# Patient Record
Sex: Female | Born: 1970 | Race: Black or African American | Hispanic: No | Marital: Single | Smoking: Never smoker
Health system: Southern US, Community
[De-identification: ages and names within clinical notes are randomized; demographics above are authoritative.]

## PROBLEM LIST (undated history)

## (undated) DIAGNOSIS — Z9889 Other specified postprocedural states: Secondary | ICD-10-CM

## (undated) DIAGNOSIS — D649 Anemia, unspecified: Secondary | ICD-10-CM

## (undated) DIAGNOSIS — N970 Female infertility associated with anovulation: Secondary | ICD-10-CM

## (undated) DIAGNOSIS — R112 Nausea with vomiting, unspecified: Secondary | ICD-10-CM

## (undated) DIAGNOSIS — A6000 Herpesviral infection of urogenital system, unspecified: Secondary | ICD-10-CM

## (undated) DIAGNOSIS — K635 Polyp of colon: Secondary | ICD-10-CM

## (undated) DIAGNOSIS — Z8719 Personal history of other diseases of the digestive system: Secondary | ICD-10-CM

## (undated) DIAGNOSIS — Z800 Family history of malignant neoplasm of digestive organs: Secondary | ICD-10-CM

## (undated) DIAGNOSIS — R7303 Prediabetes: Secondary | ICD-10-CM

## (undated) DIAGNOSIS — Z8742 Personal history of other diseases of the female genital tract: Secondary | ICD-10-CM

## (undated) DIAGNOSIS — R42 Dizziness and giddiness: Secondary | ICD-10-CM

## (undated) DIAGNOSIS — I10 Essential (primary) hypertension: Secondary | ICD-10-CM

## (undated) DIAGNOSIS — K589 Irritable bowel syndrome without diarrhea: Secondary | ICD-10-CM

## (undated) DIAGNOSIS — G43909 Migraine, unspecified, not intractable, without status migrainosus: Secondary | ICD-10-CM

## (undated) DIAGNOSIS — R011 Cardiac murmur, unspecified: Secondary | ICD-10-CM

## (undated) HISTORY — DX: Migraine, unspecified, not intractable, without status migrainosus: G43.909

## (undated) HISTORY — DX: Essential (primary) hypertension: I10

## (undated) HISTORY — DX: Polyp of colon: K63.5

## (undated) HISTORY — DX: Family history of malignant neoplasm of digestive organs: Z80.0

## (undated) HISTORY — PX: DILATION AND CURETTAGE OF UTERUS: SHX78

## (undated) HISTORY — DX: Irritable bowel syndrome, unspecified: K58.9

## (undated) HISTORY — DX: Herpesviral infection of urogenital system, unspecified: A60.00

## (undated) HISTORY — DX: Female infertility associated with anovulation: N97.0

## (undated) HISTORY — DX: Personal history of other diseases of the female genital tract: Z87.42

---

## 1997-07-07 HISTORY — PX: LAPAROSCOPY: SHX197

## 2005-04-11 ENCOUNTER — Ambulatory Visit: Payer: Self-pay | Admitting: Neurology

## 2009-03-27 ENCOUNTER — Emergency Department: Payer: Self-pay | Admitting: Emergency Medicine

## 2010-02-22 ENCOUNTER — Emergency Department: Payer: Self-pay | Admitting: Emergency Medicine

## 2010-10-26 ENCOUNTER — Emergency Department: Payer: Self-pay | Admitting: Emergency Medicine

## 2011-01-07 ENCOUNTER — Ambulatory Visit: Payer: Self-pay | Admitting: Family Medicine

## 2011-02-05 DIAGNOSIS — Z8742 Personal history of other diseases of the female genital tract: Secondary | ICD-10-CM

## 2011-02-05 HISTORY — DX: Personal history of other diseases of the female genital tract: Z87.42

## 2012-05-07 HISTORY — PX: URETHRA SURGERY: SHX824

## 2012-05-26 ENCOUNTER — Ambulatory Visit: Payer: Self-pay | Admitting: Urology

## 2012-05-31 ENCOUNTER — Ambulatory Visit: Payer: Self-pay | Admitting: Urology

## 2012-06-01 LAB — PATHOLOGY REPORT

## 2013-11-30 DIAGNOSIS — N970 Female infertility associated with anovulation: Secondary | ICD-10-CM

## 2013-11-30 HISTORY — DX: Female infertility associated with anovulation: N97.0

## 2014-01-26 ENCOUNTER — Ambulatory Visit: Payer: Self-pay | Admitting: Obstetrics and Gynecology

## 2014-08-11 ENCOUNTER — Encounter: Payer: Self-pay | Admitting: Podiatry

## 2014-08-11 ENCOUNTER — Ambulatory Visit (INDEPENDENT_AMBULATORY_CARE_PROVIDER_SITE_OTHER): Payer: Commercial Managed Care - PPO | Admitting: Podiatry

## 2014-08-11 ENCOUNTER — Ambulatory Visit (INDEPENDENT_AMBULATORY_CARE_PROVIDER_SITE_OTHER): Payer: Commercial Managed Care - PPO

## 2014-08-11 ENCOUNTER — Ambulatory Visit: Payer: Self-pay

## 2014-08-11 ENCOUNTER — Ambulatory Visit: Payer: Commercial Managed Care - PPO

## 2014-08-11 VITALS — BP 147/72 | HR 82 | Resp 16

## 2014-08-11 DIAGNOSIS — M79671 Pain in right foot: Secondary | ICD-10-CM

## 2014-08-11 DIAGNOSIS — M779 Enthesopathy, unspecified: Secondary | ICD-10-CM

## 2014-08-11 MED ORDER — TRIAMCINOLONE ACETONIDE 10 MG/ML IJ SUSP
10.0000 mg | Freq: Once | INTRAMUSCULAR | Status: AC
  Administered 2014-08-11: 10 mg

## 2014-08-11 NOTE — Progress Notes (Signed)
   Subjective:    Patient ID: Mckenzie Lozano, female    DOB: Feb 12, 1971, 44 y.o.   MRN: [id number]  HPI Comments: "I have pain in the right one"  Patient c/o sharp sensation 4th toe and MPJ right for about 2 weeks. She remembers twisting her ankle at some point. Swelling plantar forefoot. AM pain and any time after resting. She tried padding-helped.  Foot Pain Associated symptoms include abdominal pain, chest pain, coughing, diaphoresis, fatigue and headaches.      Review of Systems  Constitutional: Positive for diaphoresis and fatigue.  HENT: Positive for sinus pressure and tinnitus.   Eyes: Positive for itching.  Respiratory: Positive for cough.   Cardiovascular: Positive for chest pain and leg swelling.  Gastrointestinal: Positive for abdominal pain, diarrhea and constipation.  Endocrine: Positive for polyuria.  Genitourinary: Positive for frequency.  Musculoskeletal: Positive for back pain and gait problem.  Allergic/Immunologic: Positive for environmental allergies.  Neurological: Positive for dizziness, light-headedness and headaches.  Hematological: Bruises/bleeds easily.       Objective:   Physical Exam        Assessment & Plan:

## 2014-08-13 NOTE — Progress Notes (Signed)
Subjective:     Patient ID: Mckenzie Lozano, female   DOB: 1970/08/16, 44 y.o.   MRN: [id number]  HPI patient presents stating I'm having a lot of pain on my right foot around this joint that's making it hard for me to walk comfortably or wear shoe gear with any degree of comfort. States it's been present for about a month   Review of Systems  All other systems reviewed and are negative.      Objective:   Physical Exam  Constitutional: She is oriented to person, place, and time.  Cardiovascular: Intact distal pulses.   Musculoskeletal: Normal range of motion.  Neurological: She is oriented to person, place, and time.  Skin: Skin is warm.  Nursing note and vitals reviewed.  vascular status intact muscle strength adequate with range of motion within normal limits. Patient's noted to have forefoot edema with inflammation and pain around the fourth metatarsal phalangeal joint and no indications of a more proximal inflammation around the bone structure     Assessment:     Probable inflammatory capsulitis fourth MPJ right and cannot rule out bony stress fracture or arthritis    Plan:     H&P and x-ray reviewed. Explained condition and today did proximal nerve block around the fourth MPJ aspirated the joint giving out a small amount of clear fluid and injected with a quarter cc of dexamethasone Kenalog and applied thick plantar pad to reduce stress on the joint. Reappoint 2 weeks

## 2014-10-24 NOTE — H&P (Signed)
PATIENT NAME:  Mckenzie Lozano, Mckenzie Lozano MR#:  [id number] DATE OF BIRTH:  [birth year]  DATE OF ADMISSION:  05/31/2012  CHIEF COMPLAINT: Recurrent urinary tract infections and difficulty voiding.   HISTORY OF PRESENT ILLNESS: Mckenzie Lozano is a 44 year old African American female who has had five or six UTIs in the past year. She reports deflexion of her urinary stream. She was found to have a cystic lesion at the 6 o'clock position of the urethral meatus consistent with caruncle. She comes in now for excisional biopsy of this lesion.   ALLERGIES: No drug allergies.   CURRENT MEDICATIONS: Multivitamins.   PAST SURGICAL HISTORY: Diagnostic laparoscopy in 1999.   SOCIAL HISTORY: She denied tobacco use. She consumes 1 to 4 alcoholic beverages per week.   FAMILY HISTORY: Remarkable for parents with hypertension and hypercholesterolemia.   PAST AND CURRENT MEDICAL CONDITIONS: 1. Migraine headaches.  2. Gastroesophageal reflux disease.  3. Herpes simplex type II.   REVIEW OF SYSTEMS: The patient has lost 20 pounds in last six months. She denied chest pain or shortness of breath. She does have difficulty sleeping. She has some fatigue. She has chronic constipation.   PHYSICAL EXAMINATION:   GENERAL: A well-nourished African American female in no distress.   HEENT: Sclerae were clear. Pupils were equally round and reactive to light and accommodation. Extraocular movements were intact.   NECK: No palpable masses or tenderness. Thyroid gland was smooth and nontender. No audible carotid bruits.   PULMONARY: Lungs were clear to auscultation.   CARDIOVASCULAR: Regular rhythm and rate without audible murmurs.   ABDOMEN: Soft, nontender abdomen.   GENITOURINARY: The patient had a slightly raised area of the urethral meatus, at approximately the 5e o'clock position. No vaginal prolapse. Good anterior support.   RECTAL: No palpable rectal masses.   NEUROPSYCHIATRIC: Alert and oriented x3.   IMPRESSION:   1. 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 2. Recurrent urinary tract infections.  PLAN: Excisional biopsy of the urethral lesion and cystoscopy.  ____________________________ Otelia Limes. Yves Dill, MD mrw:slb D: 05/26/2012 12:05:00 ET T: 05/26/2012 12:15:56 ET JOB#: [id number]  cc: Otelia Limes. Yves Dill, MD, <Dictator> Royston Cowper MD ELECTRONICALLY SIGNED 05/26/2012 14:49

## 2014-10-24 NOTE — Op Note (Signed)
PATIENT NAME:  Mckenzie Lozano, MASSAR MR#:  [id number] DATE OF BIRTH:  1971/01/06  DATE OF PROCEDURE:  05/31/2012  PREOPERATIVE DIAGNOSES:  1. Urethral caruncle.  2. Recurrent urinary tract infections.   POSTOPERATIVE DIAGNOSES:  1. Urethral caruncle.  2. Recurrent urinary tract infections.   PROCEDURES:  1. Cystoscopy.  2. Excisional biopsy of urethral caruncle.   SURGEON: Otelia Limes. Yves Dill, MD  ANESTHETIST: Marcello Moores.   ANESTHETIC METHOD: General.   INDICATIONS: See the dictated history and physical. After informed consent patient requests above procedure.   OPERATIVE SUMMARY: After adequate general anesthesia had been obtained, patient was placed into dorsal lithotomy position and the perineum was prepped and draped in the usual fashion. The 21 French cystoscope was then coupled with the camera and then placed into the bladder. Bladder was thoroughly inspected. Both ureteral orifices were identified and had clear efflux. No bladder mucosal lesions were identified. At this point the scope was removed. Labia were retracted laterally with 2-0 silk ties. Patient was noted to have a 5 mm cystic lesion at the 6:00 position of the urethral meatus consistent with caruncle. The lesion was sharply excised and bleeders were controlled with electrocautery. Urethral edges were reapproximated with interrupted 4-0 chromic suture.     At this point procedure was terminated. Patient tolerated procedure well and was transferred to the recovery room in stable condition.   ____________________________ Otelia Limes. Yves Dill, MD mrw:cms D: 05/31/2012 08:35:58 ET T: 05/31/2012 10:14:41 ET JOB#: [id number]  cc: Otelia Limes. Yves Dill, MD, <Dictator>  Entered as incorrect report type; entered as consultation and should be operative report.  Royston Cowper MD ELECTRONICALLY SIGNED 05/31/2012 13:08

## 2014-10-28 NOTE — Op Note (Signed)
PATIENT NAME:  Mckenzie Lozano, Mckenzie Lozano MR#:  [id number] DATE OF BIRTH:  06/29/1971  DATE OF PROCEDURE:  01/26/2014  PREPROCEDURE DIAGNOSIS: Infertility.  POSTPROCEDURE DIAGNOSIS: Infertility.  PROCEDURE: Hysterosalpingogram under fluoroscopy.  PROCEDURE PERFORMER: Erik Obey, MD  ESTIMATED BLOOD LOSS: 2 mL.   FINDINGS: Normal fluoroscopy. The uterus to fill and spill bilaterally.   DESCRIPTION OF PROCEDURE: The patient was taken to radiology. Side-opening speculum was placed in the patient's vagina. This was removed after a bulb catheter was placed into the uterus and the radiopaque dye was placed into the uterus. The patient was then laid supine and the patient tolerated the procedure well.  ____________________________ Delsa Sale, MD cck:sb D: 01/30/2014 23:08:23 ET T: 01/31/2014 07:07:26 ET JOB#: [id number]  cc: Delsa Sale, MD, <Dictator> Delsa Sale MD ELECTRONICALLY SIGNED 02/02/2014 16:00

## 2015-08-30 ENCOUNTER — Other Ambulatory Visit: Payer: Self-pay | Admitting: Nurse Practitioner

## 2015-08-30 DIAGNOSIS — R1032 Left lower quadrant pain: Principal | ICD-10-CM

## 2015-08-30 DIAGNOSIS — G8929 Other chronic pain: Secondary | ICD-10-CM

## 2015-09-04 ENCOUNTER — Ambulatory Visit
Admission: RE | Admit: 2015-09-04 | Discharge: 2015-09-04 | Disposition: A | Discharge status: Home / Self Care | Payer: 59 | Source: Ambulatory Visit | Attending: Nurse Practitioner | Admitting: Nurse Practitioner

## 2015-09-04 DIAGNOSIS — D1803 Hemangioma of intra-abdominal structures: Secondary | ICD-10-CM | POA: Insufficient documentation

## 2015-09-04 DIAGNOSIS — R1032 Left lower quadrant pain: Secondary | ICD-10-CM | POA: Diagnosis present

## 2015-09-04 DIAGNOSIS — G8929 Other chronic pain: Secondary | ICD-10-CM | POA: Diagnosis present

## 2015-09-04 LAB — POCT I-STAT CREATININE: Creatinine, Ser: 0.8 mg/dL (ref 0.44–1.00)

## 2015-09-04 MED ORDER — IOHEXOL 300 MG/ML  SOLN
100.0000 mL | Freq: Once | INTRAMUSCULAR | Status: AC | PRN
  Administered 2015-09-04: 100 mL via INTRAVENOUS

## 2015-10-06 HISTORY — PX: COLONOSCOPY: SHX174

## 2017-03-30 IMAGING — CT CT ABD-PELV W/ CM
1 of 3 series · 14 of 32 positions shown, 19 images · IV contrast (APPLIED)
Comparison: CT abdomen report 05/22/2006

CLINICAL DATA: LEFT side abdominal pain for 20 years.

EXAM:
CT ABDOMEN AND PELVIS WITH CONTRAST
TECHNIQUE: Multidetector CT imaging of the abdomen and pelvis was performed
using the standard protocol following bolus administration of
intravenous contrast.
CONTRAST:  100mL OMNIPAQUE IOHEXOL 300 MG/ML  SOLN

[Series 2: axial st · axial · 0.68mm/px · z∈[-910,-495]mm · 14 of 93 slices shown, 19 images]
[im 5/93  soft-tissue]
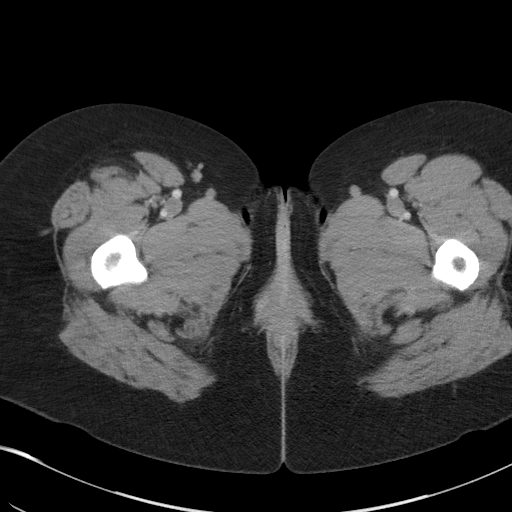
[im 5/93  bone]
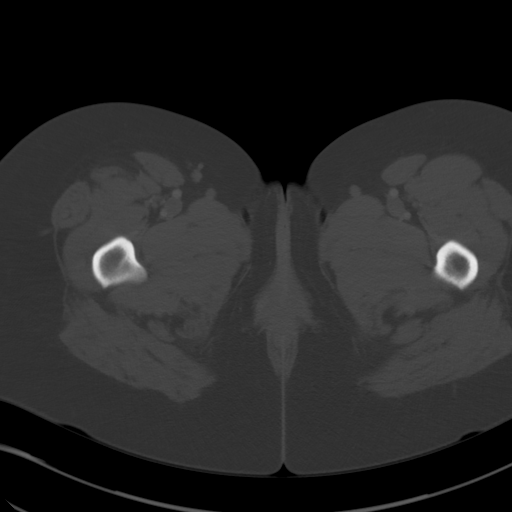
[im 15/93  soft-tissue]
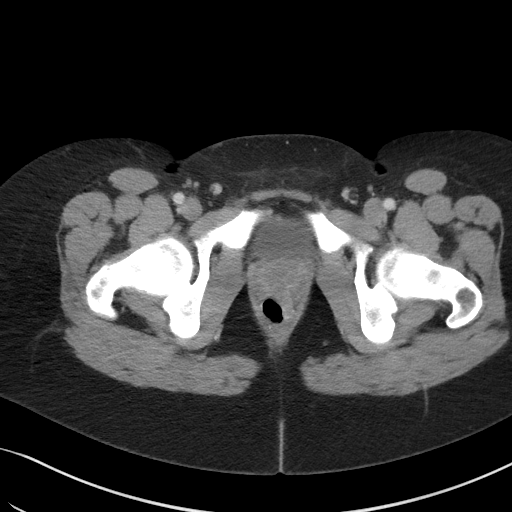
[im 20/93  soft-tissue]
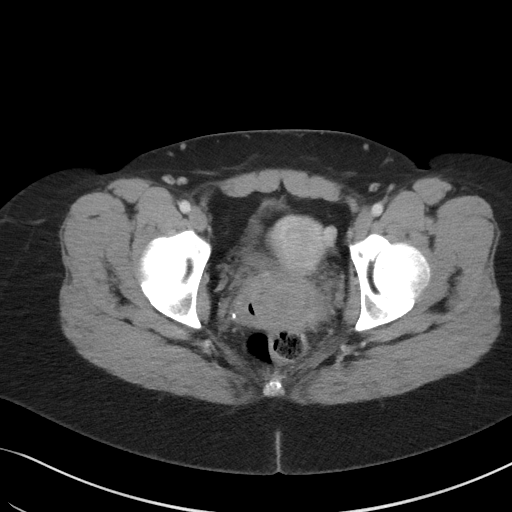
[im 25/93  soft-tissue]
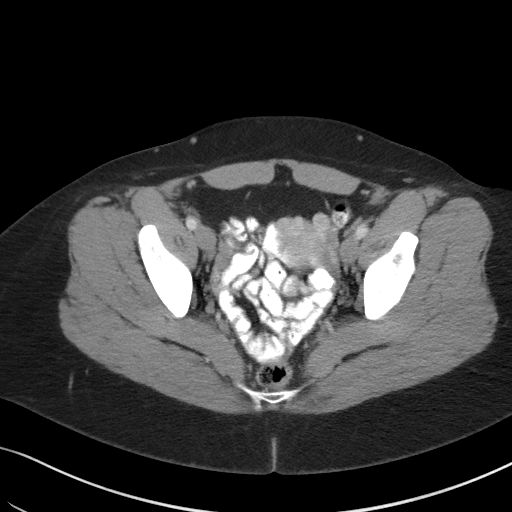
[im 34/93  soft-tissue]
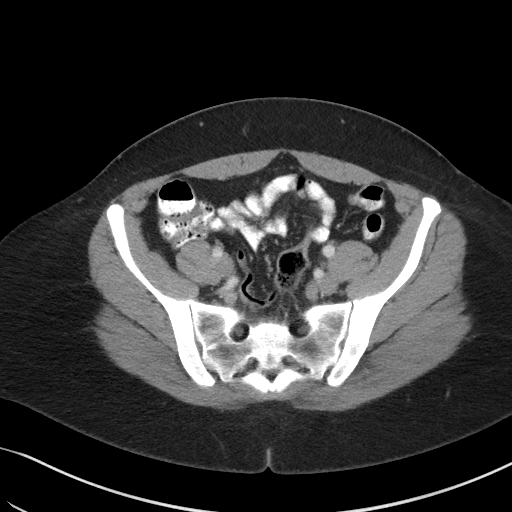
[im 39/93  soft-tissue]
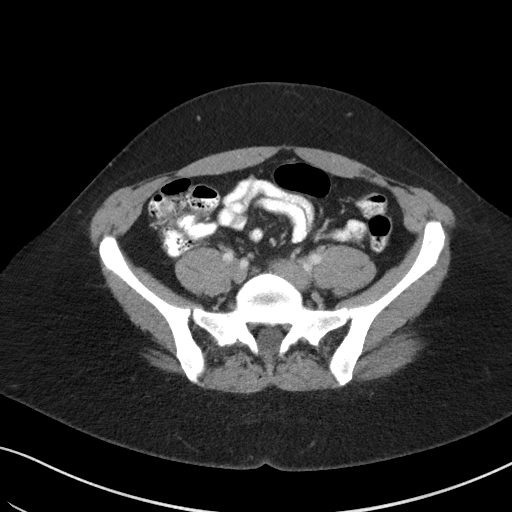
[im 49/93  soft-tissue]
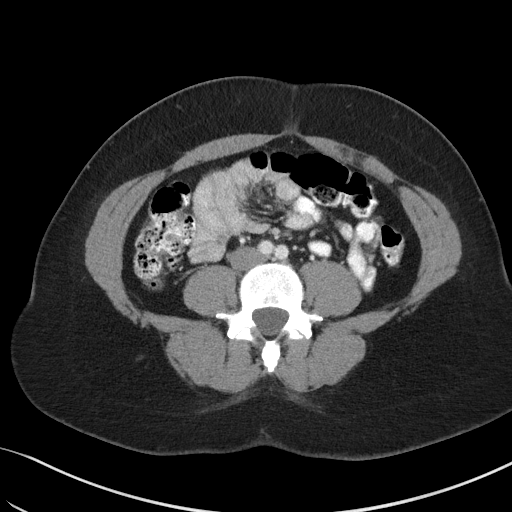
[im 54/93  soft-tissue]
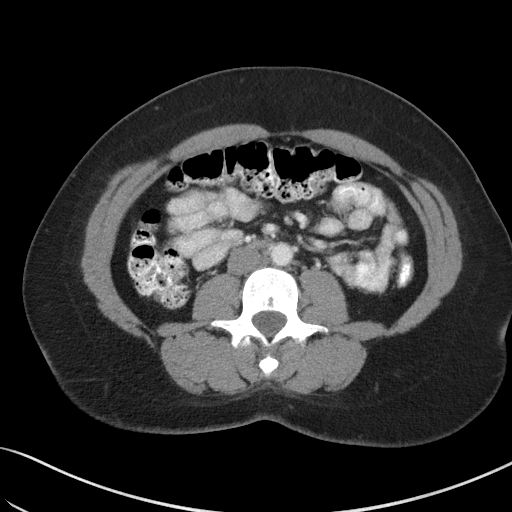
[im 59/93  soft-tissue]
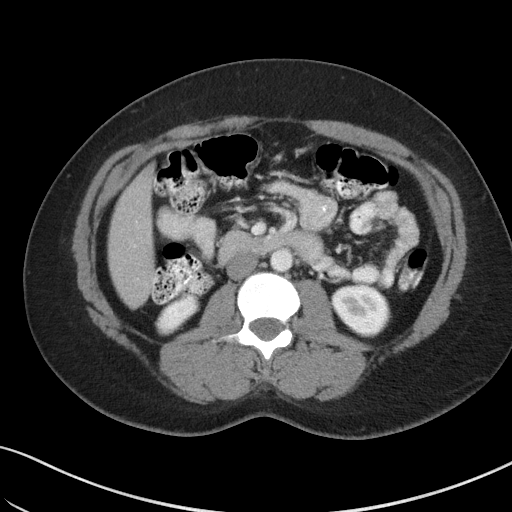
[im 59/93  bone]
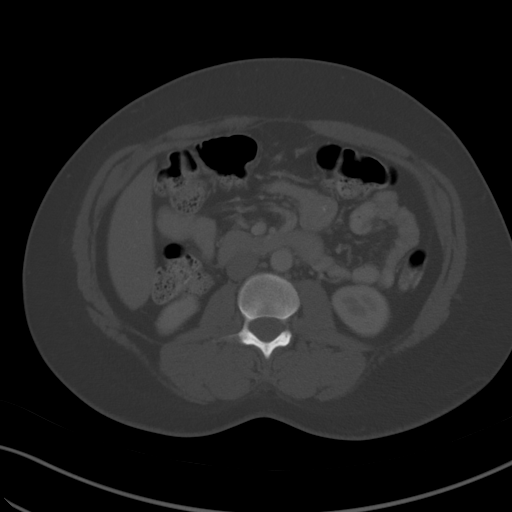
[im 68/93  soft-tissue]
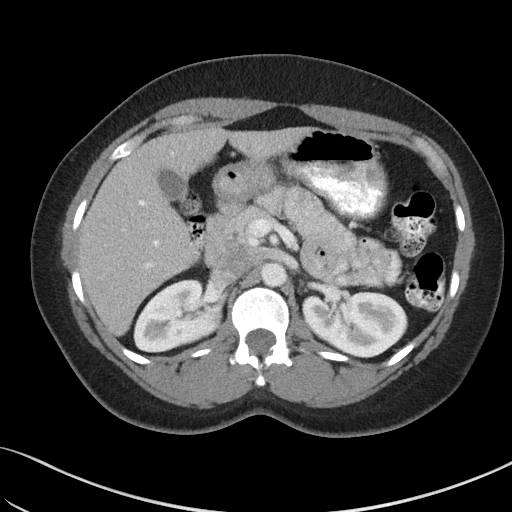
[im 73/93  soft-tissue]
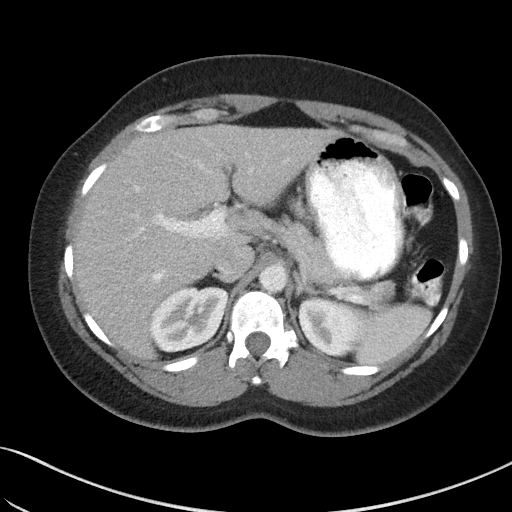
[im 73/93  lung]
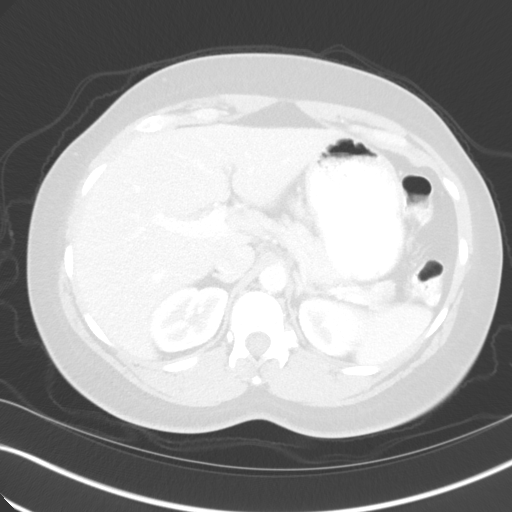
[im 78/93  soft-tissue]
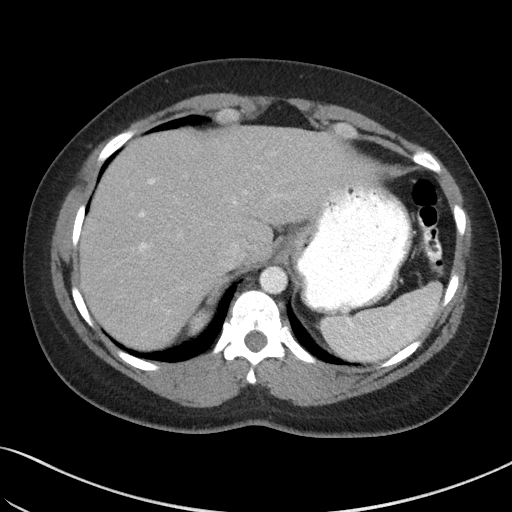
[im 78/93  lung]
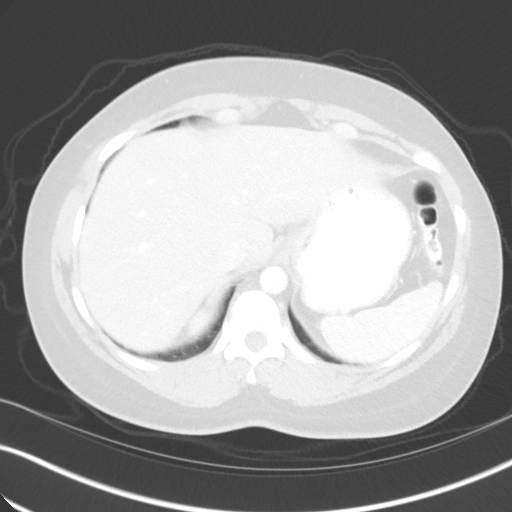
[im 83/93  lung]
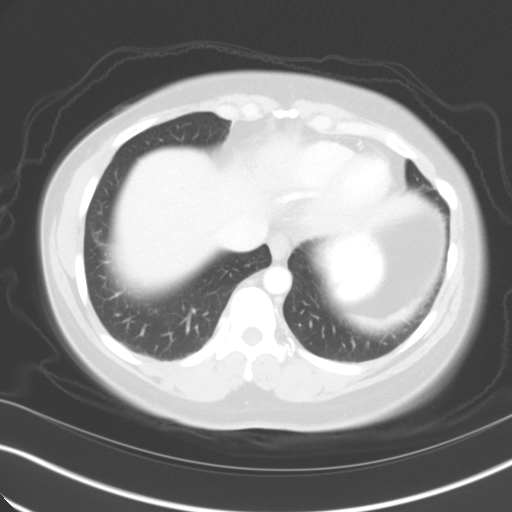
[im 88/93  soft-tissue]
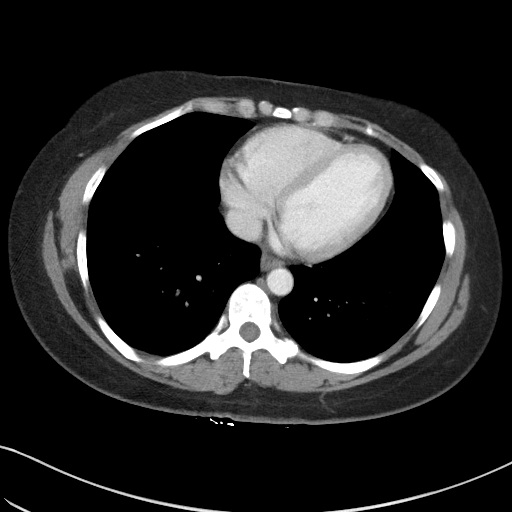
[im 88/93  lung]
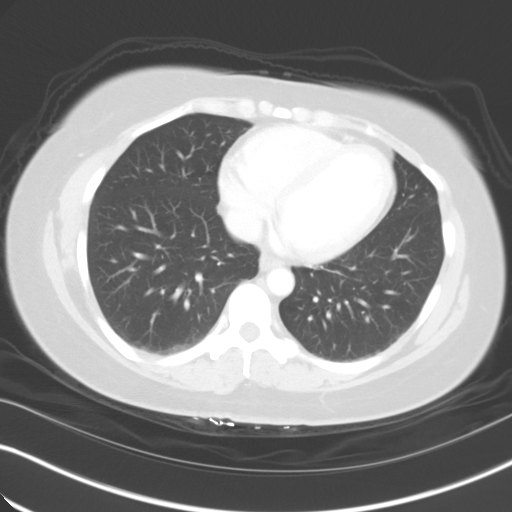

[14 of 32 positions shown; findings below may reference images not displayed]

FINDINGS: Lower chest: Lung bases are clear.

Hepatobiliary: Small enhancing lesion in the subcapsular RIGHT
hepatic lobe measuring 7 mm image 32, series 2. Normal gallbladder.
Normal common bile duct.

Pancreas: Pancreas is normal. No ductal dilatation. No pancreatic
inflammation.

Spleen: Normal spleen

Adrenals/urinary tract: Adrenal glands and kidneys are normal. The
ureters and bladder normal.

Stomach/Bowel: Stomach, small bowel, appendix, and cecum are normal.
The colon and rectosigmoid colon are normal.

Vascular/Lymphatic: Abdominal aorta is normal caliber. There is no
retroperitoneal or periportal lymphadenopathy. No pelvic
lymphadenopathy.

Reproductive: Uterus and ovaries are normal.  No free fluid.

Other: No peritoneal disease.  No inguinal hernia or ventral hernia.

Musculoskeletal: No aggressive osseous lesion.
IMPRESSION: 1. No acute findings in the abdomen pelvis.
2. No ventral hernia or inguinal hernia .
3. Small enhancing lesion in the inferior RIGHT hepatic lobe is too
small to characterize but favor a benign hemangioma. If patient has
risk factors for malignancy, lesion could be further evaluated with
contrast abdominal MRI.

## 2017-04-15 ENCOUNTER — Telehealth: Payer: Self-pay

## 2017-04-15 ENCOUNTER — Other Ambulatory Visit: Payer: Self-pay | Admitting: Obstetrics and Gynecology

## 2017-04-15 DIAGNOSIS — R6889 Other general symptoms and signs: Secondary | ICD-10-CM

## 2017-04-15 DIAGNOSIS — R7303 Prediabetes: Secondary | ICD-10-CM

## 2017-04-15 NOTE — Telephone Encounter (Signed)
Pt requesting a referral to an endocrinologist re hormone levels as she is having difficulty c sweating, cold intolerance, and blurred vision. [phone]

## 2017-04-15 NOTE — Telephone Encounter (Signed)
OK 

## 2017-04-15 NOTE — Progress Notes (Signed)
Pt wants ref to endocrine for blurred vision, cold intolerance, sweating. Hasn't had recent thyroid labs. Orders in computer to check them first. See eye MD for vision exam, first, too.

## 2017-04-15 NOTE — Telephone Encounter (Signed)
Needs labs first. Hx of pre-DM 2017. Hasn't had recent thyroid labs. Orders in computer. See eye MD for vision exam, first, too. If abn thyroid labs, can refer to endocrine. RN to discuss with pt.

## 2017-04-15 NOTE — Telephone Encounter (Signed)
Pt aware.  States she had eye exam in 02/2017.  Sched c lab 10/16th.

## 2017-04-21 ENCOUNTER — Other Ambulatory Visit: Payer: 59

## 2017-04-21 DIAGNOSIS — R7303 Prediabetes: Secondary | ICD-10-CM

## 2017-04-21 DIAGNOSIS — R6889 Other general symptoms and signs: Secondary | ICD-10-CM

## 2017-04-22 LAB — COMPREHENSIVE METABOLIC PANEL
A/G RATIO: 1.3 (ref 1.2–2.2)
ALT: 12 IU/L (ref 0–32)
AST: 16 IU/L (ref 0–40)
Albumin: 3.9 g/dL (ref 3.5–5.5)
Alkaline Phosphatase: 85 IU/L (ref 39–117)
BUN/Creatinine Ratio: 17 (ref 9–23)
BUN: 13 mg/dL (ref 6–24)
Bilirubin Total: 0.5 mg/dL (ref 0.0–1.2)
CALCIUM: 8.9 mg/dL (ref 8.7–10.2)
CO2: 24 mmol/L (ref 20–29)
CREATININE: 0.75 mg/dL (ref 0.57–1.00)
Chloride: 104 mmol/L (ref 96–106)
GFR, EST AFRICAN AMERICAN: 111 mL/min/{1.73_m2} (ref >59)
GFR, EST NON AFRICAN AMERICAN: 97 mL/min/{1.73_m2} (ref >59)
GLUCOSE: 90 mg/dL (ref 65–99)
Globulin, Total: 2.9 g/dL (ref 1.5–4.5)
POTASSIUM: 4.1 mmol/L (ref 3.5–5.2)
Sodium: 142 mmol/L (ref 134–144)
TOTAL PROTEIN: 6.8 g/dL (ref 6.0–8.5)

## 2017-04-22 LAB — HEMOGLOBIN A1C
ESTIMATED AVERAGE GLUCOSE: 120 mg/dL
Hgb A1c MFr Bld: 5.8 % — ABNORMAL HIGH (ref 4.8–5.6)

## 2017-04-22 LAB — TSH+FREE T4
FREE T4: 1.03 ng/dL (ref 0.82–1.77)
TSH: 1.75 u[IU]/mL (ref 0.450–4.500)

## 2017-05-22 ENCOUNTER — Encounter: Payer: Self-pay | Admitting: Obstetrics and Gynecology

## 2017-05-26 ENCOUNTER — Encounter: Payer: Self-pay | Admitting: Obstetrics and Gynecology

## 2017-08-06 ENCOUNTER — Other Ambulatory Visit: Payer: Self-pay | Admitting: Podiatry

## 2017-08-12 ENCOUNTER — Ambulatory Visit: Payer: 59 | Admitting: Obstetrics and Gynecology

## 2017-08-13 ENCOUNTER — Ambulatory Visit (INDEPENDENT_AMBULATORY_CARE_PROVIDER_SITE_OTHER): Payer: Managed Care, Other (non HMO) | Admitting: Obstetrics and Gynecology

## 2017-08-13 ENCOUNTER — Encounter: Payer: Self-pay | Admitting: Obstetrics and Gynecology

## 2017-08-13 VITALS — BP 124/82 | Ht 62.0 in | Wt 182.0 lb

## 2017-08-13 DIAGNOSIS — Z1231 Encounter for screening mammogram for malignant neoplasm of breast: Secondary | ICD-10-CM

## 2017-08-13 DIAGNOSIS — Z01419 Encounter for gynecological examination (general) (routine) without abnormal findings: Secondary | ICD-10-CM

## 2017-08-13 DIAGNOSIS — Z124 Encounter for screening for malignant neoplasm of cervix: Secondary | ICD-10-CM | POA: Diagnosis not present

## 2017-08-13 DIAGNOSIS — Z1239 Encounter for other screening for malignant neoplasm of breast: Secondary | ICD-10-CM

## 2017-08-13 NOTE — Progress Notes (Signed)
PCP:  Patient, No Pcp Per   Chief Complaint  Patient presents with  . Gynecologic Exam     HPI:      [name] is a 47 y.o. G2P0020 who LMP was Patient's last menstrual period was 08/07/2017 (exact date)., presents today for her annual examination.  Her menses are regular every 32-33 days, lasting 5 days.  Dysmenorrhea mild, occurring first 1-2 days of flow. She does not have intermenstrual bleeding. Takes excedrin migraine for menstrual migraines with relief.   She cont to have LUQ pain and sees GI. Sx feel like muscle spasm/twisting. Had neg GYN eval.  Sex activity: single partner, contraception - none.  Last Pap: June 05, 2017  Results were: no abnormalities /neg HPV DNA. Likes yearly paps for insurance reimbursement. Hx of STDs: HSV, no recent sx  Last mammogram: May 22, 2017  Results were: normal--routine follow-up in 12 months There is no FH of breast cancer. There is no FH of ovarian cancer. The patient does do self-breast exams.  Tobacco use: The patient denies current or previous tobacco use. Alcohol use: social drinker No drug use.  Exercise: moderately active  She does get adequate calcium and Vitamin D in her diet.  Labs with PCP. Colonoscopy about 3 yrs ago. Due again in 2 yrs due to personal and FH polyps.    Past Medical History:  Diagnosis Date  . Anovulation 11/30/2013  . Colon polyps   . Diabetes mellitus without complication ([place])   . Herpes genitalis   . History of ovarian cyst 02/2011   right  . IBS (irritable bowel syndrome)   . Migraine     Past Surgical History:  Procedure Laterality Date  . COLONOSCOPY  10/2015   polyp, hemorrhoids; repeat in 5 yrs  . LAPAROSCOPY  1999   hiatal hernia  . URETHRA SURGERY  05/2012    Family History  Problem Relation Age of Onset  . Hypertension Mother   . Diabetes Brother   . Hyperlipidemia Brother   . Heart attack Brother     Social History   Socioeconomic History  .  Marital status: Single    Spouse name: Not on file  . Number of children: Not on file  . Years of education: Not on file  . Highest education level: Not on file  Social Needs  . Financial resource strain: Not on file  . Food insecurity - worry: Not on file  . Food insecurity - inability: Not on file  . Transportation needs - medical: Not on file  . Transportation needs - non-medical: Not on file  Occupational History  . Not on file  Tobacco Use  . Smoking status: Never Smoker  . Smokeless tobacco: Never Used  Substance and Sexual Activity  . Alcohol use: Yes    Alcohol/week: 0.0 oz  . Drug use: No  . Sexual activity: Yes    Birth control/protection: None  Other Topics Concern  . Not on file  Social History Narrative  . Not on file    Current Meds  Medication Sig  . amoxicillin-clavulanate (AUGMENTIN) 875-125 MG tablet Take 1 tablet by mouth every 12 (twelve) hours.  . Cholecalciferol (VITAMIN D-1000 MAX ST) 1000 units tablet Take by mouth.  . fluticasone (FLONASE) 50 MCG/ACT nasal spray PLACE 1 SPRAY INTO BOTH NOSTRILS 2 (TWO) TIMES DAILY     ROS:  Review of Systems  Constitutional: Positive for fatigue. Negative for fever and unexpected weight change.  Respiratory: Negative  for cough, shortness of breath and wheezing.   Cardiovascular: Negative for chest pain, palpitations and leg swelling.  Gastrointestinal: Positive for constipation and diarrhea. Negative for blood in stool, nausea and vomiting.  Endocrine: Negative for cold intolerance, heat intolerance and polyuria.  Genitourinary: Positive for dyspareunia. Negative for dysuria, flank pain, frequency, genital sores, hematuria, menstrual problem, pelvic pain, urgency, vaginal bleeding, vaginal discharge and vaginal pain.  Musculoskeletal: Negative for back pain, joint swelling and myalgias.  Skin: Negative for rash.  Neurological: Positive for headaches. Negative for dizziness, syncope, light-headedness and  numbness.  Hematological: Negative for adenopathy.  Psychiatric/Behavioral: Negative for agitation, confusion, sleep disturbance and suicidal ideas. The patient is not nervous/anxious.      Objective: BP 124/82   Ht 5\' 2"  (1.575 m)   Wt 182 lb (82.6 kg)   LMP 08/07/2017 (Exact Date)   BMI 33.29 kg/m    Physical Exam  Constitutional: She is oriented to person, place, and time. She appears well-developed and well-nourished.  Genitourinary: Vagina normal and uterus normal. There is no rash or tenderness on the right labia. There is no rash or tenderness on the left labia. No erythema or tenderness in the vagina. No vaginal discharge found. Right adnexum does not display mass and does not display tenderness. Left adnexum does not display mass and does not display tenderness. Cervix does not exhibit motion tenderness or polyp. Uterus is not enlarged or tender.  Neck: Normal range of motion. No thyromegaly present.  Cardiovascular: Normal rate and regular rhythm.  Murmur heard.  Systolic murmur is present with a grade of 1/6. Pulmonary/Chest: Effort normal and breath sounds normal. Right breast exhibits no mass, no nipple discharge, no skin change and no tenderness. Left breast exhibits no mass, no nipple discharge, no skin change and no tenderness.  Abdominal: Soft. There is no tenderness. There is no guarding.  Musculoskeletal: Normal range of motion.  Neurological: She is alert and oriented to person, place, and time. No cranial nerve deficit.  Psychiatric: She has a normal mood and affect. Her behavior is normal.  Vitals reviewed.   Assessment/Plan: Encounter for annual routine gynecological examination  Cervical cancer screening - Plain pap for ins reimbursement. - Plan: Pap IG (Image Guided)  Screening for breast cancer - Pt to sched 11/19. - Plan: MM DIGITAL SCREENING BILATERAL   GYN counsel breast self exam, mammography screening, menopause, adequate intake of calcium and  vitamin D, diet and exercise     F/U  Return in about 1 year (around 08/13/2018).  Kirstyn Lean B. Jakaleb Payer, PA-C 08/13/2017 3:50 PM

## 2017-08-13 NOTE — Patient Instructions (Signed)
I value your feedback and entrusting us with your care. If you get a [facility] patient survey, I would appreciate you taking the time to let us know about your experience today. Thank you! 

## 2017-08-15 LAB — PAP IG (IMAGE GUIDED): PAP SMEAR COMMENT: 0

## 2017-09-24 ENCOUNTER — Other Ambulatory Visit: Payer: Self-pay | Admitting: Internal Medicine

## 2017-09-24 DIAGNOSIS — R1031 Right lower quadrant pain: Secondary | ICD-10-CM

## 2017-09-24 DIAGNOSIS — R1032 Left lower quadrant pain: Principal | ICD-10-CM

## 2017-09-25 ENCOUNTER — Ambulatory Visit: Admission: RE | Admit: 2017-09-25 | Payer: Managed Care, Other (non HMO) | Source: Ambulatory Visit

## 2017-09-28 ENCOUNTER — Encounter
Admission: RE | Admit: 2017-09-28 | Discharge: 2017-09-28 | Disposition: A | Discharge status: Home / Self Care | Payer: Managed Care, Other (non HMO) | Source: Ambulatory Visit | Attending: Podiatry | Admitting: Podiatry

## 2017-09-28 ENCOUNTER — Other Ambulatory Visit: Payer: Self-pay

## 2017-09-28 DIAGNOSIS — Z01818 Encounter for other preprocedural examination: Secondary | ICD-10-CM | POA: Diagnosis present

## 2017-09-28 HISTORY — DX: Nausea with vomiting, unspecified: R11.2

## 2017-09-28 HISTORY — DX: Personal history of other diseases of the digestive system: Z87.19

## 2017-09-28 HISTORY — DX: Dizziness and giddiness: R42

## 2017-09-28 HISTORY — DX: Anemia, unspecified: D64.9

## 2017-09-28 HISTORY — DX: Cardiac murmur, unspecified: R01.1

## 2017-09-28 HISTORY — DX: Prediabetes: R73.03

## 2017-09-28 HISTORY — DX: Other specified postprocedural states: Z98.890

## 2017-09-28 NOTE — Patient Instructions (Signed)
Your procedure is scheduled on: Friday 10/02/17 Report to [place]. To find out your arrival time please call [phone] between 1PM - 3PM on Thursday 10/01/17.  Remember: Instructions that are not followed completely may result in serious medical risk, up to and including death, or upon the discretion of your surgeon and anesthesiologist your surgery may need to be rescheduled.     _X__ 1. Do not eat food after midnight the night before your procedure.                 No gum chewing or hard candies. You may drink clear liquids up to 2 hours                 before you are scheduled to arrive for your surgery- DO not drink clear                 liquids within 2 hours of the start of your surgery.                 Clear Liquids include:  water, apple juice without pulp, clear carbohydrate                 drink such as Clearfast or Gatorade, Black Coffee or Tea (Do not add                 anything to coffee or tea).  __X__2.  On the morning of surgery brush your teeth with toothpaste and water, you                 may rinse your mouth with mouthwash if you wish.  Do not swallow any              toothpaste of mouthwash.     _X__ 3.  No Alcohol for 24 hours before or after surgery.   _X__ 4.  Do Not Smoke or use e-cigarettes For 24 Hours Prior to Your Surgery.                 Do not use any chewable tobacco products for at least 6 hours prior to                 surgery.  ____  5.  Bring all medications with you on the day of surgery if instructed.   __X__  6.  Notify your doctor if there is any change in your medical condition      (cold, fever, infections).     Do not wear jewelry, make-up, hairpins, clips or nail polish. Do not wear lotions, powders, or perfumes.  Do not shave 48 hours prior to surgery. Men may shave face and neck. Do not bring valuables to the hospital.    [facility] is not responsible for any belongings or  valuables.  Contacts, dentures/partials or body piercings may not be worn into surgery. Bring a case for your contacts, glasses or hearing aids, a denture cup will be supplied. Leave your suitcase in the car. After surgery it may be brought to your room. For patients admitted to the hospital, discharge time is determined by your treatment team.   Patients discharged the day of surgery will not be allowed to drive home.   Please read over the following fact sheets that you were given:   MRSA Information  __X__ Take these medicines the morning of surgery with A SIP OF WATER:  1. NONE  2.   3.   4.  5.  6.  ____ Fleet Enema (as directed)   __X__ Use CHG Soap/SAGE wipes as directed  ____ Use inhalers on the day of surgery  ____ Stop metformin/Janumet/Farxiga 2 days prior to surgery    ____ Take 1/2 of usual insulin dose the night before surgery. No insulin the morning          of surgery.   ____ Stop Blood Thinners Coumadin/Plavix/Xarelto/Pleta/Pradaxa/Eliquis/Effient/Aspirin  on   Or contact your Surgeon, Cardiologist or Medical Doctor regarding  ability to stop your blood thinners  __X__ Stop Anti-inflammatories 7 days before surgery such as Advil, Ibuprofen, Motrin,  BC or Goodies Powder, Naprosyn, Naproxen, Aleve, Aspirin MAY TAKE TYLENOL   __X__ Stop all herbal supplements, fish oil or vitamin E until after surgery.    ____ Bring C-Pap to the hospital.

## 2017-10-01 MED ORDER — CEFAZOLIN SODIUM-DEXTROSE 2-4 GM/100ML-% IV SOLN
2.0000 g | INTRAVENOUS | Status: AC
  Administered 2017-10-02: 2 g via INTRAVENOUS

## 2017-10-02 ENCOUNTER — Encounter
Admission: RE | Disposition: A | Discharge status: Home / Self Care | Payer: Self-pay | Source: Ambulatory Visit | Attending: Podiatry

## 2017-10-02 ENCOUNTER — Ambulatory Visit: Payer: Managed Care, Other (non HMO) | Admitting: Anesthesiology

## 2017-10-02 ENCOUNTER — Ambulatory Visit
Admission: RE | Admit: 2017-10-02 | Discharge: 2017-10-02 | Disposition: A | Discharge status: Home / Self Care | Payer: Managed Care, Other (non HMO) | Source: Ambulatory Visit | Attending: Podiatry | Admitting: Podiatry

## 2017-10-02 ENCOUNTER — Encounter: Payer: Self-pay | Admitting: *Deleted

## 2017-10-02 ENCOUNTER — Other Ambulatory Visit: Payer: Self-pay

## 2017-10-02 DIAGNOSIS — M2042 Other hammer toe(s) (acquired), left foot: Secondary | ICD-10-CM | POA: Diagnosis not present

## 2017-10-02 HISTORY — PX: HAMMER TOE SURGERY: SHX385

## 2017-10-02 HISTORY — PX: WEIL OSTEOTOMY: SHX5044

## 2017-10-02 LAB — POCT PREGNANCY, URINE: Preg Test, Ur: NEGATIVE

## 2017-10-02 SURGERY — OSTEOTOMY, WEIL
Anesthesia: General | Laterality: Left

## 2017-10-02 MED ORDER — FENTANYL CITRATE (PF) 100 MCG/2ML IJ SOLN
INTRAMUSCULAR | Status: DC | PRN
  Administered 2017-10-02 (×2): 25 ug via INTRAVENOUS
  Administered 2017-10-02: 50 ug via INTRAVENOUS

## 2017-10-02 MED ORDER — PHENYLEPHRINE HCL 10 MG/ML IJ SOLN
INTRAMUSCULAR | Status: DC | PRN
Start: 2017-10-02 — End: 2017-10-02
  Administered 2017-10-02: 100 ug via INTRAVENOUS

## 2017-10-02 MED ORDER — POVIDONE-IODINE 7.5 % EX SOLN
Freq: Once | CUTANEOUS | Status: DC
  Filled 2017-10-02: qty 118

## 2017-10-02 MED ORDER — PROPOFOL 10 MG/ML IV BOLUS
INTRAVENOUS | Status: DC | PRN
Start: 2017-10-02 — End: 2017-10-02
  Administered 2017-10-02: 140 mg via INTRAVENOUS

## 2017-10-02 MED ORDER — FAMOTIDINE 20 MG PO TABS
20.0000 mg | ORAL_TABLET | Freq: Once | ORAL | Status: AC
  Administered 2017-10-02: 20 mg via ORAL

## 2017-10-02 MED ORDER — CEFAZOLIN SODIUM-DEXTROSE 2-4 GM/100ML-% IV SOLN
INTRAVENOUS | Status: AC
  Filled 2017-10-02: qty 100

## 2017-10-02 MED ORDER — SCOPOLAMINE 1 MG/3DAYS TD PT72
MEDICATED_PATCH | TRANSDERMAL | Status: AC
  Administered 2017-10-02: 1.5 mg via TRANSDERMAL
  Filled 2017-10-02: qty 1

## 2017-10-02 MED ORDER — ONDANSETRON HCL 4 MG/2ML IJ SOLN
INTRAMUSCULAR | Status: AC
  Filled 2017-10-02: qty 2

## 2017-10-02 MED ORDER — DEXAMETHASONE SODIUM PHOSPHATE 10 MG/ML IJ SOLN
INTRAMUSCULAR | Status: DC | PRN
  Administered 2017-10-02: 10 mg via INTRAVENOUS

## 2017-10-02 MED ORDER — ONDANSETRON HCL 4 MG/2ML IJ SOLN
4.0000 mg | Freq: Once | INTRAMUSCULAR | Status: AC | PRN
  Administered 2017-10-02: 4 mg via INTRAVENOUS

## 2017-10-02 MED ORDER — MIDAZOLAM HCL 2 MG/2ML IJ SOLN
INTRAMUSCULAR | Status: DC | PRN
  Administered 2017-10-02: 2 mg via INTRAVENOUS

## 2017-10-02 MED ORDER — PROPOFOL 500 MG/50ML IV EMUL
INTRAVENOUS | Status: AC
  Filled 2017-10-02: qty 50

## 2017-10-02 MED ORDER — SEVOFLURANE IN SOLN
RESPIRATORY_TRACT | Status: AC
  Filled 2017-10-02: qty 250

## 2017-10-02 MED ORDER — OXYCODONE HCL 5 MG PO TABS
ORAL_TABLET | ORAL | Status: AC
  Filled 2017-10-02: qty 1

## 2017-10-02 MED ORDER — SCOPOLAMINE 1 MG/3DAYS TD PT72
1.0000 | MEDICATED_PATCH | TRANSDERMAL | Status: DC
Start: 2017-10-02 — End: 2017-10-02
  Administered 2017-10-02: 1.5 mg via TRANSDERMAL

## 2017-10-02 MED ORDER — LACTATED RINGERS IV SOLN
INTRAVENOUS | Status: DC
  Administered 2017-10-02: 07:00:00 via INTRAVENOUS

## 2017-10-02 MED ORDER — FENTANYL CITRATE (PF) 100 MCG/2ML IJ SOLN
25.0000 ug | INTRAMUSCULAR | Status: DC | PRN
  Administered 2017-10-02: 25 ug via INTRAVENOUS

## 2017-10-02 MED ORDER — MIDAZOLAM HCL 2 MG/2ML IJ SOLN
INTRAMUSCULAR | Status: AC
  Filled 2017-10-02: qty 2

## 2017-10-02 MED ORDER — BUPIVACAINE HCL (PF) 0.5 % IJ SOLN
INTRAMUSCULAR | Status: AC
  Filled 2017-10-02: qty 30

## 2017-10-02 MED ORDER — OXYCODONE HCL 5 MG PO TABS
5.0000 mg | ORAL_TABLET | ORAL | Status: AC | PRN
  Administered 2017-10-02: 5 mg via ORAL
  Filled 2017-10-02: qty 1

## 2017-10-02 MED ORDER — BUPIVACAINE HCL (PF) 0.5 % IJ SOLN
INTRAMUSCULAR | Status: DC | PRN
  Administered 2017-10-02: 10 mL

## 2017-10-02 MED ORDER — FAMOTIDINE 20 MG PO TABS
ORAL_TABLET | ORAL | Status: AC
  Administered 2017-10-02: 20 mg via ORAL
  Filled 2017-10-02: qty 1

## 2017-10-02 MED ORDER — LIDOCAINE HCL (CARDIAC) 20 MG/ML IV SOLN
INTRAVENOUS | Status: DC | PRN
  Administered 2017-10-02: 100 mg via INTRAVENOUS

## 2017-10-02 MED ORDER — OXYCODONE HCL 5 MG PO TABS: 5.0000 mg | ORAL_TABLET | ORAL | 0 refills | Status: DC | PRN

## 2017-10-02 MED ORDER — FENTANYL CITRATE (PF) 100 MCG/2ML IJ SOLN
INTRAMUSCULAR | Status: AC
  Filled 2017-10-02: qty 2

## 2017-10-02 MED ORDER — ONDANSETRON HCL 4 MG/2ML IJ SOLN
INTRAMUSCULAR | Status: DC | PRN
  Administered 2017-10-02: 4 mg via INTRAVENOUS

## 2017-10-02 SURGICAL SUPPLY — 48 items
BAG COUNTER SPONGE EZ (MISCELLANEOUS) ×2 IMPLANT
BANDAGE ELASTIC 4 LF NS (GAUZE/BANDAGES/DRESSINGS) ×2 IMPLANT
BIT DRILL TWST CANN 2.2X1.87MM (DRILL) ×1 IMPLANT
BLADE OSC/SAGITTAL MD 5.5X18 (BLADE) ×2 IMPLANT
BLADE OSC/SAGITTAL MD 9X18.5 (BLADE) IMPLANT
BLADE SURG 15 STRL LF DISP TIS (BLADE) ×4 IMPLANT
BLADE SURG 15 STRL SS (BLADE) ×4
BLADE SURG MINI STRL (BLADE) ×4 IMPLANT
BNDG ELASTIC 2X5.8 VLCR STR LF (GAUZE/BANDAGES/DRESSINGS) ×4 IMPLANT
BNDG ESMARK 4X12 TAN STRL LF (GAUZE/BANDAGES/DRESSINGS) ×2 IMPLANT
CANISTER SUCT 1200ML W/VALVE (MISCELLANEOUS) ×2 IMPLANT
COUNTERSINK 2.2 (MISCELLANEOUS) ×2
CUFF TOURN 18 STER (MISCELLANEOUS) ×2 IMPLANT
CUFF TOURN DUAL PL 12 NO SLV (MISCELLANEOUS) ×2 IMPLANT
DRILL TWIST CANN 2.2X1.87MM (DRILL) ×2
DURAPREP 26ML APPLICATOR (WOUND CARE) ×2 IMPLANT
ELECT REM PT RETURN 9FT ADLT (ELECTROSURGICAL) ×2
ELECTRODE REM PT RTRN 9FT ADLT (ELECTROSURGICAL) ×1 IMPLANT
GAUZE PETRO XEROFOAM 1X8 (MISCELLANEOUS) ×2 IMPLANT
GLOVE BIO SURGEON STRL SZ7.5 (GLOVE) ×2 IMPLANT
GLOVE INDICATOR 8.0 STRL GRN (GLOVE) ×2 IMPLANT
GOWN STRL REUS W/ TWL LRG LVL3 (GOWN DISPOSABLE) ×2 IMPLANT
GOWN STRL REUS W/TWL LRG LVL3 (GOWN DISPOSABLE) ×2
K-WIRE 0.8X100 (WIRE) ×4
KWIRE 0.8X100 (WIRE) ×2 IMPLANT
LABEL OR SOLS (LABEL) ×2 IMPLANT
NEEDLE FILTER BLUNT 18X 1/2SAF (NEEDLE) ×1
NEEDLE FILTER BLUNT 18X1 1/2 (NEEDLE) ×1 IMPLANT
NEEDLE HYPO 25X1 1.5 SAFETY (NEEDLE) ×4 IMPLANT
NS IRRIG 500ML POUR BTL (IV SOLUTION) ×2 IMPLANT
PACK EXTREMITY ARMC (MISCELLANEOUS) ×2 IMPLANT
PENCIL ELECTRO HAND CTR (MISCELLANEOUS) ×2 IMPLANT
RASP SM TEAR CROSS CUT (RASP) ×2 IMPLANT
SCREW CANN 2.2X12 (Screw) ×2 IMPLANT
SCREW COUNTERSINK 2.2 (MISCELLANEOUS) ×1 IMPLANT
SOL PREP PVP 2OZ (MISCELLANEOUS) ×2
SOLUTION PREP PVP 2OZ (MISCELLANEOUS) ×1 IMPLANT
SPLINT CAST 1 STEP 3X12 (MISCELLANEOUS) ×2 IMPLANT
SPLINT CAST 1 STEP 5X30 WHT (MISCELLANEOUS) ×2 IMPLANT
STOCKINETTE STRL 6IN 960660 (GAUZE/BANDAGES/DRESSINGS) ×2 IMPLANT
STRAP SAFETY 5IN WIDE (MISCELLANEOUS) ×2 IMPLANT
STRIP CLOSURE SKIN 1/4X4 (GAUZE/BANDAGES/DRESSINGS) ×2 IMPLANT
SUT ETHILON 5-0 FS-2 18 BLK (SUTURE) ×2 IMPLANT
SUT VIC AB 4-0 FS2 27 (SUTURE) ×2 IMPLANT
SWABSTK COMLB BENZOIN TINCTURE (MISCELLANEOUS) ×2 IMPLANT
SYR 10ML LL (SYRINGE) ×2 IMPLANT
WIRE Z .045 C-WIRE SPADE TIP (WIRE) ×2 IMPLANT
WIRE Z .062 C-WIRE SPADE TIP (WIRE) ×2 IMPLANT

## 2017-10-02 NOTE — Discharge Instructions (Addendum)
1.  Elevate left lower extremity on 2 pillows.  2.  Keep the bandage on the left foot clean, dry, and do not remove.  3.  Sponge bathe only left lower extremity.  4.  Wear surgical shoe on the left foot whenever walking or standing.  5.  Take 1 pain pill, oxycodone, every 4 hours if needed for pain.     AMBULATORY SURGERY  DISCHARGE INSTRUCTIONS   1) The drugs that you were given will stay in your system until tomorrow so for the next 24 hours you should not:  A) Drive an automobile B) Make any legal decisions C) Drink any alcoholic beverage   2) You may resume regular meals tomorrow.  Today it is better to start with liquids and gradually work up to solid foods.  You may eat anything you prefer, but it is better to start with liquids, then soup and crackers, and gradually work up to solid foods.   3) Please notify your doctor immediately if you have any unusual bleeding, trouble breathing, redness and pain at the surgery site, drainage, fever, or pain not relieved by medication.    4) Additional Instructions:        Please contact your physician with any problems or Same Day Surgery at [phone], Monday through Friday 6 am to 4 pm, or [place] at [facility] number at [phone].

## 2017-10-02 NOTE — Progress Notes (Signed)
States migraine has improved

## 2017-10-02 NOTE — Progress Notes (Signed)
Can wiggle toes on left foot   Skin warm and dry    Elevated on pillows   Capillary refill positive to left foot

## 2017-10-02 NOTE — Progress Notes (Signed)
zofran given for nausea  

## 2017-10-02 NOTE — Anesthesia Post-op Follow-up Note (Signed)
Anesthesia QCDR form completed.        

## 2017-10-02 NOTE — Interval H&P Note (Signed)
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 After consideration of risks, benefits and other options for treatment, the patient has consented to  Procedure(s): WEIL OSTEOTOMY/2nd met osteotomy (Left) HAMMER TOE CORRECTION (Left) as a surgical intervention .  The patient's history has been reviewed, patient examined, no change in status, stable for surgery.  I have reviewed the patient's chart and labs.  Questions were answered to the patient's satisfaction.     Durward Fortes

## 2017-10-02 NOTE — Progress Notes (Signed)
Nausea better.

## 2017-10-02 NOTE — Anesthesia Postprocedure Evaluation (Signed)
Anesthesia Post Note  Patient: [name]  Procedure(s) Performed: WEIL OSTEOTOMY/2nd met osteotomy (Left ) HAMMER TOE CORRECTION (Left )  Patient location during evaluation: PACU Anesthesia Type: General Level of consciousness: awake and alert Pain management: pain level controlled Vital Signs Assessment: post-procedure vital signs reviewed and stable Respiratory status: spontaneous breathing and respiratory function stable Cardiovascular status: stable Anesthetic complications: no     Last Vitals:  Vitals:   10/02/17 0622  BP: 140/79  Pulse: 82  Resp: 16  Temp: (!) 36.2 C  SpO2: 100%    Last Pain:  Vitals:   10/02/17 0622  TempSrc: Tympanic                 KEPHART,WILLIAM K

## 2017-10-02 NOTE — Transfer of Care (Signed)
Immediate Anesthesia Transfer of Care Note  Patient: Mckenzie Lozano  Procedure(s) Performed: WEIL OSTEOTOMY/2nd met osteotomy (Left ) HAMMER TOE CORRECTION (Left )  Patient Location: PACU  Anesthesia Type:General  Level of Consciousness: awake and sedated  Airway & Oxygen Therapy: Patient Spontanous Breathing and Patient connected to face mask oxygen  Post-op Assessment: Report given to RN and Post -op Vital signs reviewed and stable  Post vital signs: Reviewed and stable  Last Vitals:  Vitals Value Taken Time  BP    Temp    Pulse    Resp 16 10/02/2017  9:01 AM  SpO2    Vitals shown include unvalidated device data.  Last Pain:  Vitals:   10/02/17 0622  TempSrc: Tympanic         Complications: No apparent anesthesia complications

## 2017-10-02 NOTE — Anesthesia Preprocedure Evaluation (Signed)
Anesthesia Evaluation  Patient identified by MRN, date of birth, ID band Patient awake    Reviewed: Allergy & Precautions, NPO status , Patient's Chart, lab work & pertinent test results  History of Anesthesia Complications (+) PONV and history of anesthetic complications  Airway Mallampati: II       Dental   Pulmonary neg sleep apnea, neg COPD,           Cardiovascular (-) hypertension(-) Past MI and (-) CHF (-) dysrhythmias + Valvular Problems/Murmurs (murmur, no tx)      Neuro/Psych neg Seizures    GI/Hepatic Neg liver ROS, hiatal hernia, GERD  Medicated and Controlled,  Endo/Other  neg diabetes  Renal/GU negative Renal ROS     Musculoskeletal   Abdominal   Peds  Hematology  (+) anemia ,   Anesthesia Other Findings   Reproductive/Obstetrics                             Anesthesia Physical Anesthesia Plan  ASA: II  Anesthesia Plan: General   Post-op Pain Management:    Induction: Intravenous  PONV Risk Score and Plan: 3 and TIVA, Propofol infusion and Ondansetron  Airway Management Planned: Nasal Cannula  Additional Equipment:   Intra-op Plan:   Post-operative Plan:   Informed Consent: I have reviewed the patients History and Physical, chart, labs and discussed the procedure including the risks, benefits and alternatives for the proposed anesthesia with the patient or authorized representative who has indicated his/her understanding and acceptance.     Plan Discussed with:   Anesthesia Plan Comments:         Anesthesia Quick Evaluation

## 2017-10-02 NOTE — Op Note (Signed)
Date of operation: 10/02/2017.  Surgeon: Durward Fortes D.P.M.  Preoperative diagnosis: Hammertoe with pre-dislocation syndrome and capsulitis left second toe.  Postoperative diagnosis: Same.  Procedure: 1.  Hammertoe correction left second toe. 2.  Second metatarsal osteotomy left foot.  Anesthesia: LMA with local.  Hemostasis: Pneumatic tourniquet left ankle 250 mmHg.  Estimated blood loss: Less than 5 cc.  Materials: 1 2.2 mm Medartis compression screw, 12 mm length.  1 0.045 inch K wire.  Complications: None apparent.  Operative indications: This is a 47 year old female with some chronic pain from hammertoe and malaligned joint in her left foot.  Patient elects for surgical realignment.  Operative procedure: Patient was taken to the operating room and placed on the table in the supine position.  Following satisfactory LMA anesthesia the left foot was anesthetized with 10 cc of 0.5% bupivacaine plain around the second metatarsal.  A pneumatic tourniquet was applied at the level of the left ankle and the foot was prepped and draped in the usual sterile fashion.  Foot was exsanguinated and the tourniquet inflated to 250 mmHg.      Attention was then directed to the dorsal aspect of the left foot and second toe where an approximate 8 cm linear incision with an S-curve at the level of the metatarsal phalangeal joint was placed over the toe and extending onto the forefoot over the second metatarsal.  The incision was deepened via sharp and blunt dissection down to the level of the proximal interphalangeal joint on the toe where a transverse tenotomy was performed and the capsular and periosteal tissues were reflected off of the head of the proximal phalanx.  Using a pneumatic saw the head of the proximal phalanx was resected and removed in toto.  Good rectus alignment of the toe.  Dissection was then carried back proximally down to the level of the metatarsal phalangeal joint.  The medial aspect  of the hood apparatus and joint structures were released using a Beaver blade and a linear capsulotomy was then performed over the dorsal aspect of the joint with care taken to retract the extensor tendon laterally.  Periosteal and capsular tissues were reflected off of the head of the second metatarsal and an osteotomy was then performed from dorsal distal to plantar proximal through the second metatarsal.  The metatarsal head was then mobilized medially and slightly proximal.  Redundant bone dorsally was removed using a rongeur.  Intraoperative FluoroScan views revealed good alignment of the second toe at this point.  Under standard technique a wire from the Medartis screw set was then inserted for temporary fixation and then a 2.2 mm, 12 mm length Medartis compression screw was inserted into the second metatarsal in standard fashion.  Intraoperative FluoroScan views revealed good alignment and fixation at the second metatarsal with alignment of the second toe.  The wound was flushed with copious amounts of sterile saline and a 0.045 inch K wire was then driven through the middle and distal phalanx of the second toe and then retrograded through the proximal phalanx and into the second metatarsal.  FluoroScan views again revealed good alignment of the second ray and good fixation.  The wound was flushed with copious amounts of sterile saline and then closed using 4-0 Vicryl simple interrupted suture for tendon reapproximation at the proximal interphalangeal joint followed by running suture for all layers from periosteal and capsular closure to deep and superficial subcutaneous and skin closure.  Tincture benzoin and Steri-Strips applied followed by Xeroform and a  sterile gauze bandage.  Tourniquet was released and blood flow noted return immediately to the left foot and all digits.  A posterior fiberglass splint was then applied to the left lower extremity with the foot 90 degrees relative to the leg.  Patient  tolerated the procedure and anesthesia well and was awakened and transported to the PACU with vital signs stable in good condition.

## 2017-10-02 NOTE — Anesthesia Procedure Notes (Signed)
Procedure Name: LMA Insertion Date/Time: 10/02/2017 7:26 AM Performed by: Nelda Marseille, CRNA Pre-anesthesia Checklist: Patient identified, Patient being monitored, Timeout performed, Emergency Drugs available and Suction available Patient Re-evaluated:Patient Re-evaluated prior to induction Oxygen Delivery Method: Circle system utilized Preoxygenation: Pre-oxygenation with 100% oxygen Induction Type: IV induction Ventilation: Mask ventilation without difficulty LMA: LMA inserted LMA Size: 4.0 Tube type: Oral Number of attempts: 1 Placement Confirmation: positive ETCO2 and breath sounds checked- equal and bilateral Tube secured with: Tape Dental Injury: Teeth and Oropharynx as per pre-operative assessment

## 2018-05-28 ENCOUNTER — Encounter: Payer: Self-pay | Admitting: Obstetrics and Gynecology

## 2018-05-31 ENCOUNTER — Encounter: Payer: Self-pay | Admitting: Obstetrics and Gynecology

## 2018-08-16 ENCOUNTER — Ambulatory Visit (INDEPENDENT_AMBULATORY_CARE_PROVIDER_SITE_OTHER): Payer: Managed Care, Other (non HMO) | Admitting: Obstetrics and Gynecology

## 2018-08-16 ENCOUNTER — Encounter: Payer: Self-pay | Admitting: Obstetrics and Gynecology

## 2018-08-16 VITALS — BP 138/82 | HR 79 | Ht 62.0 in | Wt 184.0 lb

## 2018-08-16 DIAGNOSIS — N760 Acute vaginitis: Secondary | ICD-10-CM

## 2018-08-16 DIAGNOSIS — Z01419 Encounter for gynecological examination (general) (routine) without abnormal findings: Secondary | ICD-10-CM

## 2018-08-16 DIAGNOSIS — Z124 Encounter for screening for malignant neoplasm of cervix: Secondary | ICD-10-CM

## 2018-08-16 DIAGNOSIS — B9689 Other specified bacterial agents as the cause of diseases classified elsewhere: Secondary | ICD-10-CM

## 2018-08-16 DIAGNOSIS — Z1239 Encounter for other screening for malignant neoplasm of breast: Secondary | ICD-10-CM

## 2018-08-16 LAB — POCT WET PREP WITH KOH
CLUE CELLS WET PREP PER HPF POC: POSITIVE
KOH PREP POC: POSITIVE — AB
TRICHOMONAS UA: NEGATIVE
Yeast Wet Prep HPF POC: NEGATIVE

## 2018-08-16 MED ORDER — METRONIDAZOLE 500 MG PO TABS: 500.0000 mg | ORAL_TABLET | Freq: Two times a day (BID) | ORAL | 0 refills | Status: AC

## 2018-08-16 NOTE — Patient Instructions (Signed)
I value your feedback and entrusting us with your care. If you get a [facility] patient survey, I would appreciate you taking the time to let us know about your experience today. Thank you! 

## 2018-08-16 NOTE — Progress Notes (Addendum)
PCP:  Glendon Axe, MD   Chief Complaint  Patient presents with  . Gynecologic Exam  . [employer] Employee     HPI:      [name] is a 48 y.o. [id number] who LMP was Patient's last menstrual period was 07/30/2018 (exact date)., presents today for her annual examination.  Her menses are regular every 32-33 days, lasting 5 days.  Dysmenorrhea mild, occurring first 1-2 days of flow. She does not have intermenstrual bleeding. Takes excedrin migraine for menstrual migraines and dysmen with relief.   Sex activity: single partner, contraception - none. Pt has intermittent LLQ pain with sex for the past few yrs. Has a hx of pelvic pain with neg GYN u/s in past and neg pelvic CT 2017. Pt with hx of IBS and may be related to sx.   Pt also has noted a foul, fishy smell a couple days at end of her period as well as after sex. Sx for the past yr. No meds to treat, sx resolve spontaneously.   Last Pap: 08/13/17 Results were: no abnormalities /neg HPV DNA 2018. Likes yearly paps for insurance reimbursement. Hx of STDs: HSV, no recent sx  Last mammogram: 05/28/18 at [facility] Results were: normal--routine follow-up in 12 months There is no FH of breast cancer. There is no FH of ovarian cancer. The patient does do self-breast exams.  Tobacco use: The patient denies current or previous tobacco use. Alcohol use: social drinker No drug use.  Exercise: moderately active  She does get adequate calcium and Vitamin D in her diet.  Labs with PCP. Colonoscopy about 4 yrs ago. Due again after 5 yrs due to personal and FH polyps.    Past Medical History:  Diagnosis Date  . Anemia   . Anovulation 11/30/2013  . Colon polyps   . Dizziness   . Heart murmur   . Herpes genitalis   . History of hiatal hernia   . History of ovarian cyst 02/2011   right  . IBS (irritable bowel syndrome)   . Migraine   . PONV (postoperative nausea and vomiting)   . Pre-diabetes     Past Surgical History:    Procedure Laterality Date  . COLONOSCOPY  10/2015   polyp, hemorrhoids; repeat in 5 yrs  . DILATION AND CURETTAGE OF UTERUS    . HAMMER TOE SURGERY Left 10/02/2017   Procedure: HAMMER TOE CORRECTION;  Surgeon: Sharlotte Alamo, DPM;  Location: [facility] ORS;  Service: Podiatry;  Laterality: Left;  . LAPAROSCOPY  1999   hiatal hernia  . URETHRA SURGERY  05/2012  . WEIL OSTEOTOMY Left 10/02/2017   Procedure: WEIL OSTEOTOMY/2nd met osteotomy;  Surgeon: Sharlotte Alamo, DPM;  Location: [facility] ORS;  Service: Podiatry;  Laterality: Left;    Family History  Problem Relation Age of Onset  . Hypertension Mother   . Diabetes Brother   . Hyperlipidemia Brother   . Heart attack Brother     Social History   Socioeconomic History  . Marital status: Single    Spouse name: Not on file  . Number of children: Not on file  . Years of education: Not on file  . Highest education level: Not on file  Occupational History  . Not on file  Social Needs  . Financial resource strain: Not on file  . Food insecurity:    Worry: Not on file    Inability: Not on file  . Transportation needs:    Medical: Not on file  Non-medical: Not on file  Tobacco Use  . Smoking status: Never Smoker  . Smokeless tobacco: Never Used  Substance and Sexual Activity  . Alcohol use: Yes    Alcohol/week: 0.0 standard drinks  . Drug use: No  . Sexual activity: Yes    Birth control/protection: None  Lifestyle  . Physical activity:    Days per week: 3 days    Minutes per session: 30 min  . Stress: Only a little  Relationships  . Social connections:    Talks on phone: More than three times a week    Gets together: More than three times a week    Attends religious service: More than 4 times per year    Active member of club or organization: Yes    Attends meetings of clubs or organizations: More than 4 times per year    Relationship status: Never married  . Intimate partner violence:    Fear of current or ex partner: No     Emotionally abused: No    Physically abused: No    Forced sexual activity: No  Other Topics Concern  . Not on file  Social History Narrative  . Not on file    Current Meds  Medication Sig  . CALCIUM/MAGNESIUM/ZINC FORMULA PO Take 1 tablet by mouth daily.  . Cholecalciferol (VITAMIN D-3) 5000 units TABS Take 1 tablet by mouth daily.  . fluticasone (FLONASE) 50 MCG/ACT nasal spray Place 1 spray into both nostrils daily as needed for allergies or rhinitis.  . sodium chloride (OCEAN) 0.65 % SOLN nasal spray Place 1 spray into both nostrils as needed for congestion.     ROS:  Review of Systems  Constitutional: Positive for fatigue. Negative for fever and unexpected weight change.  Respiratory: Negative for cough, shortness of breath and wheezing.   Cardiovascular: Negative for chest pain, palpitations and leg swelling.  Gastrointestinal: Positive for constipation and diarrhea. Negative for blood in stool, nausea and vomiting.  Endocrine: Negative for cold intolerance, heat intolerance and polyuria.  Genitourinary: Positive for dyspareunia. Negative for dysuria, flank pain, frequency, genital sores, hematuria, menstrual problem, pelvic pain, urgency, vaginal bleeding, vaginal discharge and vaginal pain.  Musculoskeletal: Negative for back pain, joint swelling and myalgias.  Skin: Negative for rash.  Neurological: Positive for headaches. Negative for dizziness, syncope, light-headedness and numbness.  Hematological: Negative for adenopathy.  Psychiatric/Behavioral: Negative for agitation, confusion, sleep disturbance and suicidal ideas. The patient is not nervous/anxious.      Objective: BP 138/82   Pulse 79   Ht _0  (1.575 m)   Wt 184 lb (83.5 kg)   LMP 07/30/2018 (Exact Date)   BMI 33.65 kg/m    Physical Exam Constitutional:      Appearance: She is well-developed.  Genitourinary:     Vagina and uterus normal.     No vaginal discharge, erythema or tenderness.     No  cervical motion tenderness or polyp.     Uterus is not enlarged or tender.     No right or left adnexal mass present.     Right adnexa not tender.     Left adnexa not tender.  Neck:     Musculoskeletal: Normal range of motion.     Thyroid: No thyromegaly.  Cardiovascular:     Rate and Rhythm: Normal rate and regular rhythm.     Heart sounds: Murmur present. Systolic murmur present with a grade of 1/6.  Pulmonary:     Effort: Pulmonary effort is normal.  Breath sounds: Normal breath sounds.  Chest:     Breasts:        Right: No mass, nipple discharge, skin change or tenderness.        Left: No mass, nipple discharge, skin change or tenderness.  Abdominal:     Palpations: Abdomen is soft.     Tenderness: There is no abdominal tenderness. There is no guarding.  Musculoskeletal: Normal range of motion.  Neurological:     Mental Status: She is alert and oriented to person, place, and time.     Cranial Nerves: No cranial nerve deficit.  Psychiatric:        Behavior: Behavior normal.  Vitals signs reviewed.     Assessment/Plan: Encounter for annual routine gynecological examination  Cervical cancer screening - Plan: Pap IG (Image Guided), CANCELED: Cytology - PAP  Screening for breast cancer - Pt to sched mammo at [facility] 11/20 - Plan: MM 3D SCREEN BREAST BILATERAL  Bacterial vaginosis - Pos sx/wet prep. Rx flagyl. No EtOH. Will RF if sx recur. F/u prn.  - Plan: POCT Wet Prep with KOH, metroNIDAZOLE (FLAGYL) 500 MG tablet   Meds ordered this encounter  Medications  . metroNIDAZOLE (FLAGYL) 500 MG tablet    Sig: Take 1 tablet (500 mg total) by mouth 2 (two) times daily for 7 days.    Dispense:  14 tablet    Refill:  0    Order Specific Question:   Supervising Provider    Answer:   Gae Dry [[id number]]     GYN counsel breast self exam, mammography screening, menopause, adequate intake of calcium and vitamin D, diet and exercise     F/U  Return in about 1 year  (around 08/17/2019).   B. , PA-C 08/16/2018 4:36 PM

## 2018-08-19 LAB — PAP IG (IMAGE GUIDED)

## 2019-06-17 ENCOUNTER — Encounter: Payer: Self-pay | Admitting: Obstetrics and Gynecology

## 2019-06-20 ENCOUNTER — Encounter: Payer: Self-pay | Admitting: Obstetrics and Gynecology

## 2019-08-12 ENCOUNTER — Other Ambulatory Visit: Payer: Self-pay | Admitting: Obstetrics and Gynecology

## 2019-08-12 DIAGNOSIS — Z1239 Encounter for other screening for malignant neoplasm of breast: Secondary | ICD-10-CM

## 2019-08-25 ENCOUNTER — Encounter: Payer: Self-pay | Admitting: Obstetrics and Gynecology

## 2019-08-25 ENCOUNTER — Other Ambulatory Visit: Payer: Self-pay

## 2019-08-25 ENCOUNTER — Other Ambulatory Visit (INDEPENDENT_AMBULATORY_CARE_PROVIDER_SITE_OTHER): Payer: Managed Care, Other (non HMO)

## 2019-08-25 ENCOUNTER — Ambulatory Visit (INDEPENDENT_AMBULATORY_CARE_PROVIDER_SITE_OTHER): Payer: Managed Care, Other (non HMO) | Admitting: Obstetrics and Gynecology

## 2019-08-25 ENCOUNTER — Ambulatory Visit: Payer: Managed Care, Other (non HMO) | Admitting: Obstetrics and Gynecology

## 2019-08-25 VITALS — BP 120/80 | Ht 62.0 in | Wt 193.0 lb

## 2019-08-25 DIAGNOSIS — D252 Subserosal leiomyoma of uterus: Secondary | ICD-10-CM | POA: Diagnosis not present

## 2019-08-25 DIAGNOSIS — Z01419 Encounter for gynecological examination (general) (routine) without abnormal findings: Secondary | ICD-10-CM

## 2019-08-25 DIAGNOSIS — N898 Other specified noninflammatory disorders of vagina: Secondary | ICD-10-CM

## 2019-08-25 DIAGNOSIS — Z1211 Encounter for screening for malignant neoplasm of colon: Secondary | ICD-10-CM

## 2019-08-25 DIAGNOSIS — Z0000 Encounter for general adult medical examination without abnormal findings: Secondary | ICD-10-CM

## 2019-08-25 DIAGNOSIS — R1032 Left lower quadrant pain: Secondary | ICD-10-CM | POA: Insufficient documentation

## 2019-08-25 DIAGNOSIS — N841 Polyp of cervix uteri: Secondary | ICD-10-CM | POA: Diagnosis not present

## 2019-08-25 DIAGNOSIS — Z1231 Encounter for screening mammogram for malignant neoplasm of breast: Secondary | ICD-10-CM

## 2019-08-25 DIAGNOSIS — Z803 Family history of malignant neoplasm of breast: Secondary | ICD-10-CM | POA: Diagnosis not present

## 2019-08-25 DIAGNOSIS — R350 Frequency of micturition: Secondary | ICD-10-CM

## 2019-08-25 DIAGNOSIS — Z800 Family history of malignant neoplasm of digestive organs: Secondary | ICD-10-CM | POA: Insufficient documentation

## 2019-08-25 DIAGNOSIS — Z131 Encounter for screening for diabetes mellitus: Secondary | ICD-10-CM

## 2019-08-25 DIAGNOSIS — Z124 Encounter for screening for malignant neoplasm of cervix: Secondary | ICD-10-CM

## 2019-08-25 LAB — POCT URINALYSIS DIPSTICK
Bilirubin, UA: NEGATIVE
Blood, UA: NEGATIVE
Glucose, UA: NEGATIVE
Ketones, UA: NEGATIVE
Leukocytes, UA: NEGATIVE
Nitrite, UA: NEGATIVE
Protein, UA: NEGATIVE
Spec Grav, UA: 1.02 (ref 1.010–1.025)
pH, UA: 6.5 (ref 5.0–8.0)

## 2019-08-25 LAB — POCT WET PREP WITH KOH
Clue Cells Wet Prep HPF POC: NEGATIVE
KOH Prep POC: NEGATIVE
Trichomonas, UA: NEGATIVE
Yeast Wet Prep HPF POC: NEGATIVE

## 2019-08-25 MED ORDER — METRONIDAZOLE 500 MG PO TABS: 500.0000 mg | ORAL_TABLET | Freq: Two times a day (BID) | ORAL | 0 refills | Status: AC

## 2019-08-25 NOTE — Progress Notes (Signed)
PCP:  Glendon Axe, MD   Chief Complaint  Patient presents with  . Gynecologic Exam    pt has been sweating a lot for the past 2 months  . Urinary Tract Infection    frequent urination, pelvic pain x 2 months  . [employer] Employee     HPI:      Mckenzie Lozano is a 49 y.o. G2P0020 who LMP was Patient's last menstrual period was 08/17/2019 (exact date)., presents today for her annual examination.  Her menses are regular every month, lasting 5 days.  Dysmenorrhea mild. She does not have intermenstrual bleeding. Having vasomotor sx now.  Sex activity: single partner, contraception - none. Pt has had intermittent LLQ pain with sex for the past few yrs, but it's now every time. Has a hx of pelvic pain with neg GYN u/s in past and neg pelvic CT 2017. No recent imaging. No change in sx. No pain if not sex active. No postcoital bleeding.  Pt with hx of IBS and may be related to sx.   Pt also has noted a foul, fishy smell a couple days at end of her period as well as after sex. Sx for the past couple yrs. Treats with repHresh. Treated with metronidazole with relief last yr.   Has also had urinary frequency with urgency/sometimes decreased flow, for a few months. No hematuria, LBP, fevers. Doesn't drink caffeine but eats chocolate. Hx of pre-DM on 10/18 labs.  Last Pap: 08/16/18 Results were: no abnormalities /neg HPV DNA 2018. Likes yearly paps for insurance reimbursement. Hx of STDs: HSV, no recent sx   Last mammogram: 06/17/19 at [facility]; Results were: normal--routine follow-up in 12 months There is a FH of breast cancer in her mat aunt. There is no FH of ovarian cancer. There is a FH of prostate cancer in her brother at age 78 and pancreatic cancer in her MGF. Genetic testing not done. The patient does do self-breast exams.  Tobacco use: The patient denies current or previous tobacco use. Alcohol use: social drinker No drug use.  Exercise: moderately active  She does get adequate  calcium and Vitamin D in her diet.  Labs with PCP but not recent.   Colonoscopy about 5 yrs ago at [facility]. Due again after 5 yrs due to personal and FH polyps.    Past Medical History:  Diagnosis Date  . Anemia   . Anovulation 11/30/2013  . Colon polyps   . Dizziness   . Heart murmur   . Herpes genitalis   . History of hiatal hernia   . History of ovarian cyst 02/2011   right  . IBS (irritable bowel syndrome)   . Migraine   . PONV (postoperative nausea and vomiting)   . Pre-diabetes     Past Surgical History:  Procedure Laterality Date  . COLONOSCOPY  10/2015   polyp, hemorrhoids; repeat in 5 yrs  . DILATION AND CURETTAGE OF UTERUS    . HAMMER TOE SURGERY Left 10/02/2017   Procedure: HAMMER TOE CORRECTION;  Surgeon: Sharlotte Alamo, DPM;  Location: [facility] ORS;  Service: Podiatry;  Laterality: Left;  . LAPAROSCOPY  1999   hiatal hernia  . URETHRA SURGERY  05/2012  . WEIL OSTEOTOMY Left 10/02/2017   Procedure: WEIL OSTEOTOMY/2nd met osteotomy;  Surgeon: Sharlotte Alamo, DPM;  Location: [facility] ORS;  Service: Podiatry;  Laterality: Left;    Family History  Problem Relation Age of Onset  . Hypertension Mother   . Diabetes Brother   .  Hyperlipidemia Brother   . Heart attack Brother   . Prostate cancer Brother 104  . Pancreatic cancer Maternal Grandfather 80  . Breast cancer Maternal Aunt 60    Social History   Socioeconomic History  . Marital status: Single    Spouse name: Not on file  . Number of children: Not on file  . Years of education: Not on file  . Highest education level: Not on file  Occupational History  . Not on file  Tobacco Use  . Smoking status: Never Smoker  . Smokeless tobacco: Never Used  Substance and Sexual Activity  . Alcohol use: Yes    Alcohol/week: 0.0 standard drinks  . Drug use: No  . Sexual activity: Yes    Birth control/protection: None  Other Topics Concern  . Not on file  Social History Narrative  . Not on file   Social Determinants of  Health   Financial Resource Strain:   . Difficulty of Paying Living Expenses: Not on file  Food Insecurity:   . Worried About Charity fundraiser in the Last Year: Not on file  . Ran Out of Food in the Last Year: Not on file  Transportation Needs:   . Lack of Transportation (Medical): Not on file  . Lack of Transportation (Non-Medical): Not on file  Physical Activity:   . Days of Exercise per Week: Not on file  . Minutes of Exercise per Session: Not on file  Stress:   . Feeling of Stress : Not on file  Social Connections:   . Frequency of Communication with Friends and Family: Not on file  . Frequency of Social Gatherings with Friends and Family: Not on file  . Attends Religious Services: Not on file  . Active Member of Clubs or Organizations: Not on file  . Attends Archivist Meetings: Not on file  . Marital Status: Not on file  Intimate Partner Violence:   . Fear of Current or Ex-Partner: Not on file  . Emotionally Abused: Not on file  . Physically Abused: Not on file  . Sexually Abused: Not on file    Current Meds  Medication Sig  . CALCIUM/MAGNESIUM/ZINC FORMULA PO Take 1 tablet by mouth daily.  . Cholecalciferol (VITAMIN D-3) 5000 units TABS Take 1 tablet by mouth daily.  . fluticasone (FLONASE) 50 MCG/ACT nasal spray Place 1 spray into both nostrils daily as needed for allergies or rhinitis.     ROS:  Review of Systems  Constitutional: Positive for fatigue. Negative for fever and unexpected weight change.  Respiratory: Negative for cough, shortness of breath and wheezing.   Cardiovascular: Negative for chest pain, palpitations and leg swelling.  Gastrointestinal: Positive for constipation and diarrhea. Negative for blood in stool, nausea and vomiting.  Endocrine: Negative for cold intolerance, heat intolerance and polyuria.  Genitourinary: Positive for dyspareunia, frequency and vaginal discharge. Negative for dysuria, flank pain, genital sores, hematuria,  menstrual problem, pelvic pain, urgency, vaginal bleeding and vaginal pain.  Musculoskeletal: Positive for arthralgias. Negative for back pain, joint swelling and myalgias.  Skin: Negative for rash.  Neurological: Positive for headaches. Negative for dizziness, syncope, light-headedness and numbness.  Hematological: Negative for adenopathy.  Psychiatric/Behavioral: Negative for agitation, confusion, sleep disturbance and suicidal ideas. The patient is not nervous/anxious.      Objective: BP 120/80   Ht '5\' 2"'$  (1.575 m)   Wt 193 lb (87.5 kg)   LMP 08/17/2019 (Exact Date)   BMI 35.30 kg/m    Physical  Exam Constitutional:      Appearance: She is well-developed.  Genitourinary:     Vulva, vagina, cervix, uterus and right adnexa normal.     No vulval lesion or tenderness noted.     No vaginal discharge, erythema or tenderness.     No cervical polyp.     Uterus is not enlarged or tender.     No right or left adnexal mass present.     Right adnexa not tender.     Left adnexa tender.  Neck:     Thyroid: No thyromegaly.  Cardiovascular:     Rate and Rhythm: Normal rate and regular rhythm.     Heart sounds: Murmur present. Systolic murmur present with a grade of 1/6.  Pulmonary:     Effort: Pulmonary effort is normal.     Breath sounds: Normal breath sounds.  Chest:     Breasts:        Right: No mass, nipple discharge, skin change or tenderness.        Left: No mass, nipple discharge, skin change or tenderness.  Abdominal:     Palpations: Abdomen is soft.     Tenderness: There is no abdominal tenderness. There is no guarding.  Musculoskeletal:        General: Normal range of motion.     Cervical back: Normal range of motion.  Neurological:     General: No focal deficit present.     Mental Status: She is alert and oriented to person, place, and time.     Cranial Nerves: No cranial nerve deficit.  Skin:    General: Skin is warm and dry.  Psychiatric:        Mood and Affect:  Mood normal.        Behavior: Behavior normal.        Thought Content: Thought content normal.        Judgment: Judgment normal.  Vitals reviewed.   RESULTS:  Results for orders placed or performed in visit on 08/25/19 (from the past 24 hour(s))  POCT Urinalysis Dipstick     Status: Normal   Collection Time: 08/25/19  2:44 PM  Result Value Ref Range   Color, UA yellow    Clarity, UA clear    Glucose, UA Negative Negative   Bilirubin, UA neg    Ketones, UA neg    Spec Grav, UA 1.020 1.010 - 1.025   Blood, UA neg    pH, UA 6.5 5.0 - 8.0   Protein, UA Negative Negative   Urobilinogen, UA     Nitrite, UA neg    Leukocytes, UA Negative Negative   Appearance     Odor    POCT Wet Prep with KOH     Status: Normal   Collection Time: 08/25/19  2:45 PM  Result Value Ref Range   Trichomonas, UA Negative    Clue Cells Wet Prep HPF POC neg    Epithelial Wet Prep HPF POC     Yeast Wet Prep HPF POC neg    Bacteria Wet Prep HPF POC     RBC Wet Prep HPF POC     WBC Wet Prep HPF POC     KOH Prep POC Negative Negative     ULTRASOUND REPORT  Location: [facility]  Date of Service: 08/25/2019    Indications:Pelvic Pain   Findings:  The uterus is anteverted and measures 7.5 x 3.7 x 3.5 cm. Echo texture is homogenous with evidence of focal masses. Within  the uterus are multiple suspected fibroids measuring: Fibroid 1: 13.1 x 8.5 x 11.4 mm subserosal posterior  There is anechoic fluid within the cervical canal which outlines a possible cervical polyp measuring 12.3 x 4.5 x 5.8 mm  The Endometrium measures 4.8 mm.  Right Ovary measures 2.1 x 1.3 x 1.5 cm. It is normal in appearance. Left Ovary measures 2.0 x 1.0 x 0.8 cm. It is normal in appearance. Survey of the adnexa demonstrates no adnexal masses. There is no free fluid in the cul de sac.  Impression: 1. There is one small subserosal fibroid seen. 2. There appears to be a 1.2 cm cervical polyp. 3. Normal  appearing endometrium. 4. Normal appearing ovaries.   Recommendations: 1.Clinical correlation with the patient's History and Physical Exam.  Gweneth Dimitri, RT  Assessment/Plan: Encounter for annual routine gynecological examination  Cervical cancer screening - Plan: Pap IG (Image Guided)  Encounter for screening mammogram for malignant neoplasm of breast; pt current on mammo  Family history of breast cancer - Plan: Integrated BRACAnalysis ([place]); My Risk testing discussed and done today. Will call with results.   Family history of pancreatic cancer - Plan: Integrated BRACAnalysis ([place])  Screening for colon cancer--pt to f/u with [facility] for scr colonoscopy. Also discuss LLQ pain.   LLQ pain - Plan: US PELVIS TRANSVAGINAL NON-OB (TV ONLY); Slightly tender on exam, neg GYN u/s. F/u with GI.  Vaginal odor - Plan: POCT Wet Prep with KOH, metroNIDAZOLE (FLAGYL) 500 MG tablet; Neg wet prep, pos sx and hx. Rx flagyl. Can cont repHresh, condoms. F/u prn.   Urinary frequency - Plan: POCT Urinalysis Dipstick, Urine Culture, Hemoglobin A1c; neg UA. Check C&S. Check HgA1C. If neg, limit caffeine.  Blood tests for routine general physical examination - Plan: Comprehensive metabolic panel, Hemoglobin A1c  Screening for diabetes mellitus - Plan: Hemoglobin A1c   Meds ordered this encounter  Medications  . metroNIDAZOLE (FLAGYL) 500 MG tablet    Sig: Take 1 tablet (500 mg total) by mouth 2 (two) times daily for 7 days.    Dispense:  14 tablet    Refill:  0    Order Specific Question:   Supervising Provider    Answer:   Gae Dry [[id number]]     GYN counsel breast self exam, mammography screening, menopause, adequate intake of calcium and vitamin D, diet and exercise     F/U  Return in about 1 year (around 08/24/2020).  Aseel Truxillo B. Kong Packett, PA-C 08/25/2019 3:16 PM

## 2019-08-25 NOTE — Patient Instructions (Signed)
I value your feedback and entrusting us with your care. If you get a [facility] patient survey, I would appreciate you taking the time to let us know about your experience today. Thank you!  As of June 16, 2019, your lab results will be released to your MyChart immediately, before I even have a chance to see them. Please give me time to review them and contact you if there are any abnormalities. Thank you for your patience.  

## 2019-08-26 LAB — COMPREHENSIVE METABOLIC PANEL
ALT: 15 IU/L (ref 0–32)
AST: 17 IU/L (ref 0–40)
Albumin/Globulin Ratio: 1.4 (ref 1.2–2.2)
Albumin: 4.1 g/dL (ref 3.8–4.8)
Alkaline Phosphatase: 102 IU/L (ref 39–117)
BUN/Creatinine Ratio: 13 (ref 9–23)
BUN: 12 mg/dL (ref 6–24)
Bilirubin Total: 0.5 mg/dL (ref 0.0–1.2)
CO2: 23 mmol/L (ref 20–29)
Calcium: 9.5 mg/dL (ref 8.7–10.2)
Chloride: 102 mmol/L (ref 96–106)
Creatinine, Ser: 0.91 mg/dL (ref 0.57–1.00)
GFR calc Af Amer: 87 mL/min/{1.73_m2} (ref >59)
GFR calc non Af Amer: 75 mL/min/{1.73_m2} (ref >59)
Globulin, Total: 2.9 g/dL (ref 1.5–4.5)
Glucose: 89 mg/dL (ref 65–99)
Potassium: 4.1 mmol/L (ref 3.5–5.2)
Sodium: 142 mmol/L (ref 134–144)
Total Protein: 7 g/dL (ref 6.0–8.5)

## 2019-08-26 LAB — HEMOGLOBIN A1C
Est. average glucose Bld gHb Est-mCnc: 120 mg/dL
Hgb A1c MFr Bld: 5.8 % — ABNORMAL HIGH (ref 4.8–5.6)

## 2019-08-26 NOTE — Progress Notes (Signed)
Pt aware.

## 2019-08-26 NOTE — Progress Notes (Signed)
Pls let pt know CMP normal. Still has mild pre-DM, no change from 2 yrs ago. Still waiting on C&S results. F/u prn.

## 2019-08-27 LAB — URINE CULTURE

## 2019-08-27 NOTE — Progress Notes (Signed)
Pls let pt know C&S neg. Limit caffeine/chocolate to see if urinary frequency improves. Thx

## 2019-08-29 NOTE — Progress Notes (Signed)
Pt aware.

## 2019-08-30 LAB — PAP IG (IMAGE GUIDED)

## 2019-09-05 DIAGNOSIS — Z1371 Encounter for nonprocreative screening for genetic disease carrier status: Secondary | ICD-10-CM

## 2019-09-05 HISTORY — DX: Encounter for nonprocreative screening for genetic disease carrier status: Z13.71

## 2019-09-22 ENCOUNTER — Telehealth: Payer: Self-pay | Admitting: Obstetrics and Gynecology

## 2019-09-22 NOTE — Telephone Encounter (Signed)
Discussed MyRisk testing at 08/25/19 appt. Pt spoke with GC at [facility] per [facility] requirement. GC talked pt out of testing so test canceled. I spoke with pt who was still interested in doing testing. I then called GC and test was restarted after her approval with [employer].

## 2019-09-29 ENCOUNTER — Encounter: Payer: Self-pay | Admitting: Obstetrics and Gynecology

## 2019-10-19 ENCOUNTER — Encounter: Payer: Self-pay | Admitting: Obstetrics and Gynecology

## 2019-10-19 NOTE — Telephone Encounter (Signed)
Spoke with pt re: neg MyRisk results. IBIS=9.4%. Pt had already been informed by GC due to insurance. Nothing further to do at this time. F/u prn.   Patient understands these results only apply to her and her children, and this is not indicative of genetic testing results of her other family members. It is recommended that her other family members have genetic testing done.  Pt also understands negative genetic testing doesn't mean she will never get any of these cancers.

## 2020-01-12 ENCOUNTER — Ambulatory Visit: Payer: Managed Care, Other (non HMO) | Admitting: Obstetrics and Gynecology

## 2020-02-20 ENCOUNTER — Telehealth: Payer: Self-pay | Admitting: Obstetrics and Gynecology

## 2020-02-20 MED ORDER — MEDROXYPROGESTERONE ACETATE 10 MG PO TABS: 10.0000 mg | ORAL_TABLET | Freq: Every day | ORAL | 0 refills | Status: DC

## 2020-02-20 NOTE — Telephone Encounter (Signed)
Spoke with pt. No menses since 4/21. No AUB. Most likely perimenopause given age. Rx provera just in case. Pt to call with or without withdrawal bleeding.  Mom with premature menopause in late 85s.

## 2020-02-21 ENCOUNTER — Ambulatory Visit: Payer: Managed Care, Other (non HMO) | Admitting: Obstetrics and Gynecology

## 2020-02-22 ENCOUNTER — Telehealth: Payer: Self-pay

## 2020-02-22 NOTE — Telephone Encounter (Signed)
Pt called triage line stating she saw [facility] on Monday 8/16. She forgot to get [facility] to fill out an insurance form. Can pt just drop it off or fax it then pick up later?  Pt aware it is fine for her to drop off her annual health form.

## 2020-09-14 NOTE — Progress Notes (Signed)
PCP:  Glendon Axe, MD   Chief Complaint  Patient presents with  . Gynecologic Exam    Back to having lower back pain  . [employer] Employee     HPI:      [name] is a 50 y.o. G2P0020 who LMP was Patient's last menstrual period was 08/16/2020 (approximate)., presents today for her annual examination.  Her menses are irregular now, every few months, lasting 5 days.  Dysmenorrhea mild. She does not have intermenstrual bleeding. Having vasomotor sx now.  Sex activity: no longer sex active. No vag dryness/sx.  Pt had LLQ pain with sex for the past few yrs but no pain if wasn't sex active. Has a hx of pelvic pain with neg GYN u/s in past and neg pelvic CT 2017. Neg GYN u/s 2/21.  Pt now with intermittent LT back pain that radiates to flank. Sx worse when lying in certain positions and with moving. Pain is sharp and throbbing. Sx had been worse when drinking sweet tea, and sx improved with cessation of that. Now having pain past 4 days. Sx improved with ice pack, applying pressure to back, or using pillows to prop herself up at night. No urin sx, no GI sx with back pain. No hx of kidney stones. Pt with hx of IBS and may be related to sx.   Last Pap: 08/25/19 Results were: no abnormalities /neg HPV DNA 2018. Likes yearly paps for insurance reimbursement. Hx of STDs: HSV, no recent sx   Last mammogram: 07/17/20 at [facility]; Results were: normal after addl imaging--routine follow-up in 12 months There is a FH of breast cancer in her mat aunt. There is no FH of ovarian cancer. There is a FH of prostate cancer in her brother at age 25 and pancreatic cancer in her MGF. Pt is MyRisk neg 3/21; IBIS=9.2%. The patient does do self-breast exams.  Tobacco use: The patient denies current or previous tobacco use. Alcohol use: none No drug use.  Exercise: moderately active  She does get adequate calcium and Vitamin D in her diet.  Labs with PCP but no recent HgA1C or lipid. Has new dx HTN, on  HCTZ.   Colonoscopy 2017 at [facility]. Due again after 5 yrs due to personal and FH polyps.    Past Medical History:  Diagnosis Date  . Anemia   . Anovulation 11/30/2013  . BRCA negative 09/2019   MyRisk neg; IBIS-9.2%  . Colon polyps   . Dizziness   . Family history of pancreatic cancer   . Heart murmur   . Herpes genitalis   . History of hiatal hernia   . History of ovarian cyst 02/2011   right  . Hypertension   . IBS (irritable bowel syndrome)   . Migraine   . PONV (postoperative nausea and vomiting)   . Pre-diabetes     Past Surgical History:  Procedure Laterality Date  . COLONOSCOPY  10/2015   polyp, hemorrhoids; repeat in 5 yrs  . DILATION AND CURETTAGE OF UTERUS    . HAMMER TOE SURGERY Left 10/02/2017   Procedure: HAMMER TOE CORRECTION;  Surgeon: Sharlotte Alamo, DPM;  Location: [facility] ORS;  Service: Podiatry;  Laterality: Left;  . LAPAROSCOPY  1999   hiatal hernia  . URETHRA SURGERY  05/2012  . WEIL OSTEOTOMY Left 10/02/2017   Procedure: WEIL OSTEOTOMY/2nd met osteotomy;  Surgeon: Sharlotte Alamo, DPM;  Location: [facility] ORS;  Service: Podiatry;  Laterality: Left;    Family History  Problem  Relation Age of Onset  . Hypertension Mother   . Diabetes Brother   . Hyperlipidemia Brother   . Heart attack Brother   . Prostate cancer Brother 76  . Pancreatic cancer Maternal Grandfather 80  . Breast cancer Maternal Aunt 60  . Prostate cancer Father 47  . Cancer Maternal Aunt        lymph    Social History   Socioeconomic History  . Marital status: Single    Spouse name: Not on file  . Number of children: Not on file  . Years of education: Not on file  . Highest education level: Not on file  Occupational History  . Not on file  Tobacco Use  . Smoking status: Never Smoker  . Smokeless tobacco: Never Used  Vaping Use  . Vaping Use: Never used  Substance and Sexual Activity  . Alcohol use: Yes    Alcohol/week: 0.0 standard drinks  . Drug use: No  . Sexual activity:  Not Currently    Birth control/protection: None  Other Topics Concern  . Not on file  Social History Narrative  . Not on file   Social Determinants of Health   Financial Resource Strain: Not on file  Food Insecurity: Not on file  Transportation Needs: Not on file  Physical Activity: Not on file  Stress: Not on file  Social Connections: Not on file  Intimate Partner Violence: Not on file    Current Meds  Medication Sig  . acetaminophen (TYLENOL) 500 MG tablet Take by mouth.  Marland Kitchen CALCIUM/MAGNESIUM/ZINC FORMULA PO Take 1 tablet by mouth daily.  . Cholecalciferol (VITAMIN D-3) 5000 units TABS Take 1 tablet by mouth daily.  . Ferrous Sulfate 27 MG TABS Take 1 tablet by mouth daily as needed.  . fluticasone (FLONASE) 50 MCG/ACT nasal spray Place 1 spray into both nostrils daily as needed for allergies or rhinitis.  . folic acid (FOLVITE) 1 MG tablet Take by mouth.  . hydrochlorothiazide (HYDRODIURIL) 12.5 MG tablet Take by mouth.     ROS:  Review of Systems  Constitutional: Positive for fatigue. Negative for fever and unexpected weight change.  Respiratory: Negative for cough, shortness of breath and wheezing.   Cardiovascular: Negative for chest pain, palpitations and leg swelling.  Gastrointestinal: Positive for constipation and diarrhea. Negative for blood in stool, nausea and vomiting.  Endocrine: Negative for cold intolerance, heat intolerance and polyuria.  Genitourinary: Negative for dyspareunia, dysuria, flank pain, frequency, genital sores, hematuria, menstrual problem, pelvic pain, urgency, vaginal bleeding, vaginal discharge and vaginal pain.  Musculoskeletal: Positive for back pain. Negative for arthralgias, joint swelling and myalgias.  Skin: Negative for rash.  Neurological: Positive for headaches. Negative for dizziness, syncope, light-headedness and numbness.  Hematological: Negative for adenopathy.  Psychiatric/Behavioral: Negative for agitation, confusion, sleep  disturbance and suicidal ideas. The patient is not nervous/anxious.      Objective: BP 124/90   Ht '5\' 2"'  (1.575 m)   Wt 193 lb (87.5 kg)   LMP 08/16/2020 (Approximate)   BMI 35.30 kg/m   Physical Exam Constitutional:      Appearance: She is well-developed.  Genitourinary:     Vulva normal.     Right Labia: No rash, tenderness or lesions.    Left Labia: No tenderness, lesions or rash.    No vaginal discharge, erythema or tenderness.      Right Adnexa: not tender and no mass present.    Left Adnexa: not tender and no mass present.    No  cervical friability or polyp.     Uterus is not enlarged or tender.  Breasts:     Right: No mass, nipple discharge, skin change or tenderness.     Left: No mass, nipple discharge, skin change or tenderness.    Neck:     Thyroid: No thyromegaly.  Cardiovascular:     Rate and Rhythm: Normal rate and regular rhythm.     Heart sounds: Normal heart sounds. No murmur heard.   Pulmonary:     Effort: Pulmonary effort is normal.     Breath sounds: Normal breath sounds.  Abdominal:     Palpations: Abdomen is soft.     Tenderness: There is no abdominal tenderness. There is no guarding or rebound.  Musculoskeletal:        General: Normal range of motion.       Arms:     Cervical back: Normal range of motion.  Lymphadenopathy:     Cervical: No cervical adenopathy.  Neurological:     General: No focal deficit present.     Mental Status: She is alert and oriented to person, place, and time.     Cranial Nerves: No cranial nerve deficit.  Skin:    General: Skin is warm and dry.  Psychiatric:        Mood and Affect: Mood normal.        Behavior: Behavior normal.        Thought Content: Thought content normal.        Judgment: Judgment normal.  Vitals reviewed.      Assessment/Plan: Encounter for annual routine gynecological examination  Cervical cancer screening - Plan: IGP, rfx Aptima HPV ASCU  Encounter for screening mammogram for  malignant neoplasm of breast; pt current on mammo  Screening for colon cancer - Plan: Ambulatory referral to Gastroenterology; refer to [facility] for scr colonscopy  Mid back pain on left side--muscle spams. Ice/heat/stretch/massage. F/u prn.   Pre-diabetes - Plan: Hemoglobin A1c  Screening cholesterol level - Plan: Lipid panel   GYN counsel breast self exam, mammography screening, menopause, adequate intake of calcium and vitamin D, diet and exercise     F/U  Return in about 1 year (around 09/17/2021).  Alicia B. Copland, PA-C 09/17/2020 11:08 AM

## 2020-09-14 NOTE — Patient Instructions (Signed)
I value your feedback and you entrusting us with your care. If you get a [facility] patient survey, I would appreciate you taking the time to let us know about your experience today. Thank you! ? ? ?

## 2020-09-17 ENCOUNTER — Other Ambulatory Visit: Payer: Self-pay

## 2020-09-17 ENCOUNTER — Ambulatory Visit (INDEPENDENT_AMBULATORY_CARE_PROVIDER_SITE_OTHER): Payer: Managed Care, Other (non HMO) | Admitting: Obstetrics and Gynecology

## 2020-09-17 ENCOUNTER — Encounter: Payer: Self-pay | Admitting: Obstetrics and Gynecology

## 2020-09-17 VITALS — BP 124/90 | Ht 62.0 in | Wt 193.0 lb

## 2020-09-17 DIAGNOSIS — Z1231 Encounter for screening mammogram for malignant neoplasm of breast: Secondary | ICD-10-CM | POA: Diagnosis not present

## 2020-09-17 DIAGNOSIS — R7303 Prediabetes: Secondary | ICD-10-CM

## 2020-09-17 DIAGNOSIS — Z124 Encounter for screening for malignant neoplasm of cervix: Secondary | ICD-10-CM | POA: Diagnosis not present

## 2020-09-17 DIAGNOSIS — Z01419 Encounter for gynecological examination (general) (routine) without abnormal findings: Secondary | ICD-10-CM

## 2020-09-17 DIAGNOSIS — M549 Dorsalgia, unspecified: Secondary | ICD-10-CM | POA: Insufficient documentation

## 2020-09-17 DIAGNOSIS — Z13220 Encounter for screening for lipoid disorders: Secondary | ICD-10-CM

## 2020-09-17 DIAGNOSIS — Z1211 Encounter for screening for malignant neoplasm of colon: Secondary | ICD-10-CM | POA: Diagnosis not present

## 2020-09-18 LAB — LIPID PANEL
Chol/HDL Ratio: 3.2 ratio (ref 0.0–4.4)
Cholesterol, Total: 160 mg/dL (ref 100–199)
HDL: 50 mg/dL (ref >39)
LDL Chol Calc (NIH): 94 mg/dL (ref 0–99)
Triglycerides: 88 mg/dL (ref 0–149)
VLDL Cholesterol Cal: 16 mg/dL (ref 5–40)

## 2020-09-18 LAB — HEMOGLOBIN A1C
Est. average glucose Bld gHb Est-mCnc: 120 mg/dL
Hgb A1c MFr Bld: 5.8 % — ABNORMAL HIGH (ref 4.8–5.6)

## 2020-09-18 NOTE — Progress Notes (Signed)
Pt aware.

## 2020-09-18 NOTE — Progress Notes (Signed)
Pls let pt know cholesterol is normal and pre-diabetes is stable from the past few yrs. Thx.

## 2020-09-20 LAB — IGP, RFX APTIMA HPV ASCU

## 2021-01-23 ENCOUNTER — Telehealth: Payer: Self-pay

## 2021-01-23 NOTE — Telephone Encounter (Signed)
She can try kegel exercises (can look online for directions) to strengthen pelvic floor and void at least every 1-2 hrs to keep bladder empty. Meds won't treat that. If kegels don't help, then we can refer to pelvic PT.

## 2021-01-23 NOTE — Telephone Encounter (Signed)
Called pt, no answer, mail box full could not leave voice msg.

## 2021-01-23 NOTE — Telephone Encounter (Signed)
Pt aware.

## 2021-01-23 NOTE — Telephone Encounter (Signed)
Pt calling for see if ABC treats for urinary incontinence.  [phone]

## 2021-01-23 NOTE — Telephone Encounter (Signed)
Are sx with laugh/sneeze/cough/jump or if doesn't get to bathroom in time, or both? If she drinks caffeine, she needs to stop it because that can contribute to sx. Depending on cause of sx depends on best tx options.

## 2021-01-23 NOTE — Telephone Encounter (Signed)
Pt states it's c coughing; has caffeine once ina blue moon.

## 2021-01-23 NOTE — Telephone Encounter (Signed)
Patient is returning missed call. Please advise 

## 2021-04-16 ENCOUNTER — Encounter: Payer: Self-pay | Admitting: Obstetrics and Gynecology

## 2021-04-16 ENCOUNTER — Other Ambulatory Visit: Payer: Self-pay

## 2021-04-16 ENCOUNTER — Ambulatory Visit: Payer: Managed Care, Other (non HMO) | Admitting: Obstetrics and Gynecology

## 2021-04-16 VITALS — BP 120/70 | Ht 62.0 in | Wt 188.0 lb

## 2021-04-16 DIAGNOSIS — N6320 Unspecified lump in the left breast, unspecified quadrant: Secondary | ICD-10-CM

## 2021-04-16 DIAGNOSIS — N644 Mastodynia: Secondary | ICD-10-CM | POA: Diagnosis not present

## 2021-04-16 NOTE — Patient Instructions (Signed)
I value your feedback and you entrusting us with your care. If you get a [facility] patient survey, I would appreciate you taking the time to let us know about your experience today. Thank you! ? ? ?

## 2021-04-16 NOTE — Progress Notes (Signed)
Glendon Axe, MD   Chief Complaint  Patient presents with   Breast Exam    LB pain, aereola on LB is bigger than RB    HPI:      [name] is a 50 y.o. G2P0020 whose LMP was No LMP recorded. (Menstrual status: Irregular Periods)., presents today for LT breast pain for the past 3 months. Pt sleeps on LT side due to GERD and has breast pain there. Has to push on area to get pain to stop. Also notes it sometimes when sitting up. No erythema, prior trauma, nipple d/c. Does SBE and feels like there is a "squishy" mass in inferior aspect, corresponding to area of pain. Neg mammo 07/17/20 at [facility]; had addl imaging LT breast due to asymmetry that was normal. Has a mat aunt with breast cancer; pt is MyRisk neg 3/21 due to FH pancreatic cancer. No caffeine.    Past Medical History:  Diagnosis Date   Anemia    Anovulation 11/30/2013   BRCA negative 09/2019   MyRisk neg; IBIS-9.2%   Colon polyps    Dizziness    Family history of pancreatic cancer    Heart murmur    Herpes genitalis    History of hiatal hernia    History of ovarian cyst 02/2011   right   Hypertension    IBS (irritable bowel syndrome)    Migraine    PONV (postoperative nausea and vomiting)    Pre-diabetes     Past Surgical History:  Procedure Laterality Date   COLONOSCOPY  10/2015   polyp, hemorrhoids; repeat in 5 yrs   DILATION AND CURETTAGE OF UTERUS     HAMMER TOE SURGERY Left 10/02/2017   Procedure: HAMMER TOE CORRECTION;  Surgeon: Sharlotte Alamo, DPM;  Location: [facility] ORS;  Service: Podiatry;  Laterality: Left;   LAPAROSCOPY  1999   hiatal hernia   URETHRA SURGERY  05/2012   WEIL OSTEOTOMY Left 10/02/2017   Procedure: WEIL OSTEOTOMY/2nd met osteotomy;  Surgeon: Sharlotte Alamo, DPM;  Location: [facility] ORS;  Service: Podiatry;  Laterality: Left;    Family History  Problem Relation Age of Onset   Hypertension Mother    Diabetes Brother    Hyperlipidemia Brother    Heart attack Brother    Prostate cancer  Brother 56   Pancreatic cancer Maternal Grandfather 59   Breast cancer Maternal Aunt 60   Prostate cancer Father 42   Cancer Maternal Aunt        lymph    Social History   Socioeconomic History   Marital status: Single    Spouse name: Not on file   Number of children: Not on file   Years of education: Not on file   Highest education level: Not on file  Occupational History   Not on file  Tobacco Use   Smoking status: Never   Smokeless tobacco: Never  Vaping Use   Vaping Use: Never used  Substance and Sexual Activity   Alcohol use: Yes    Alcohol/week: 0.0 standard drinks   Drug use: No   Sexual activity: Not Currently    Birth control/protection: None  Other Topics Concern   Not on file  Social History Narrative   Not on file   Social Determinants of Health   Financial Resource Strain: Not on file  Food Insecurity: Not on file  Transportation Needs: Not on file  Physical Activity: Not on file  Stress: Not on file  Social Connections: Not on  file  Intimate Partner Violence: Not on file    Outpatient Medications Prior to Visit  Medication Sig Dispense Refill   acetaminophen (TYLENOL) 500 MG tablet Take by mouth.     CALCIUM/MAGNESIUM/ZINC FORMULA PO Take 1 tablet by mouth daily.     Cholecalciferol (VITAMIN D-3) 5000 units TABS Take 1 tablet by mouth daily.     Ferrous Sulfate 27 MG TABS Take 1 tablet by mouth daily as needed.     fluticasone (FLONASE) 50 MCG/ACT nasal spray Place 1 spray into both nostrils daily as needed for allergies or rhinitis.     folic acid (FOLVITE) 1 MG tablet Take by mouth.     hydrochlorothiazide (HYDRODIURIL) 12.5 MG tablet Take by mouth.     No facility-administered medications prior to visit.      ROS:  Review of Systems  Constitutional:  Negative for fever.  Gastrointestinal:  Negative for blood in stool, constipation, diarrhea, nausea and vomiting.  Genitourinary:  Negative for dyspareunia, dysuria, flank pain, frequency,  hematuria, urgency, vaginal bleeding, vaginal discharge and vaginal pain.  Musculoskeletal:  Negative for back pain.  Skin:  Negative for rash.  BREAST: pain/mass   OBJECTIVE:   Vitals:  BP 120/70   Ht _0  (1.575 m)   Wt 188 lb (85.3 kg)   BMI 34.39 kg/m   Physical Exam Vitals reviewed.  Pulmonary:     Effort: Pulmonary effort is normal.  Chest:  Breasts:    Breasts are symmetrical.     Right: No inverted nipple, mass, nipple discharge, skin change or tenderness.     Left: No inverted nipple, mass, nipple discharge, skin change or tenderness.    Musculoskeletal:        General: Normal range of motion.     Cervical back: Normal range of motion.  Skin:    General: Skin is warm and dry.  Neurological:     General: No focal deficit present.     Mental Status: She is alert and oriented to person, place, and time.     Cranial Nerves: No cranial nerve deficit.  Psychiatric:        Mood and Affect: Mood normal.        Behavior: Behavior normal.        Thought Content: Thought content normal.        Judgment: Judgment normal.    Assessment/Plan: Mass of left breast, unspecified quadrant - Plan: MM DIAG BREAST TOMO UNI LEFT, US BREAST LTD UNI LEFT INC AXILLA  Breast pain  Check dx mammo and u/s at [place]. Pt to call to sched. Will f/u with results.     Return if symptoms worsen or fail to improve.  Ludell Zacarias B. Kirah Stice, PA-C 04/16/2021 3:18 PM

## 2021-04-22 ENCOUNTER — Other Ambulatory Visit: Payer: Self-pay | Admitting: *Deleted

## 2021-04-22 ENCOUNTER — Inpatient Hospital Stay
Admission: RE | Admit: 2021-04-22 | Discharge: 2021-04-22 | Disposition: A | Discharge status: Home / Self Care | Payer: Self-pay | Source: Ambulatory Visit | Attending: *Deleted | Admitting: *Deleted

## 2021-04-22 DIAGNOSIS — Z1231 Encounter for screening mammogram for malignant neoplasm of breast: Secondary | ICD-10-CM

## 2021-04-26 ENCOUNTER — Encounter: Payer: Self-pay | Admitting: Obstetrics and Gynecology

## 2021-04-29 ENCOUNTER — Encounter: Payer: Self-pay | Admitting: Obstetrics and Gynecology

## 2021-09-06 ENCOUNTER — Other Ambulatory Visit: Payer: Self-pay

## 2021-09-06 ENCOUNTER — Emergency Department
Admission: EM | Admit: 2021-09-06 | Discharge: 2021-09-06 | Disposition: A | Discharge status: Home / Self Care | Payer: Managed Care, Other (non HMO) | Attending: Emergency Medicine | Admitting: Emergency Medicine

## 2021-09-06 DIAGNOSIS — R1012 Left upper quadrant pain: Secondary | ICD-10-CM | POA: Diagnosis present

## 2021-09-06 DIAGNOSIS — R079 Chest pain, unspecified: Secondary | ICD-10-CM | POA: Diagnosis not present

## 2021-09-06 DIAGNOSIS — K2900 Acute gastritis without bleeding: Secondary | ICD-10-CM | POA: Diagnosis not present

## 2021-09-06 DIAGNOSIS — I509 Heart failure, unspecified: Secondary | ICD-10-CM | POA: Diagnosis not present

## 2021-09-06 DIAGNOSIS — I110 Hypertensive heart disease with heart failure: Secondary | ICD-10-CM | POA: Diagnosis not present

## 2021-09-06 LAB — URINALYSIS, COMPLETE (UACMP) WITH MICROSCOPIC
Bacteria, UA: NONE SEEN
Bilirubin Urine: NEGATIVE
Glucose, UA: NEGATIVE mg/dL
Ketones, ur: NEGATIVE mg/dL
Leukocytes,Ua: NEGATIVE
Nitrite: NEGATIVE
Protein, ur: NEGATIVE mg/dL
Specific Gravity, Urine: 1.018 (ref 1.005–1.030)
pH: 5 (ref 5.0–8.0)

## 2021-09-06 LAB — COMPREHENSIVE METABOLIC PANEL
ALT: 18 U/L (ref 0–44)
AST: 19 U/L (ref 15–41)
Albumin: 3.7 g/dL (ref 3.5–5.0)
Alkaline Phosphatase: 84 U/L (ref 38–126)
Anion gap: 8 (ref 5–15)
BUN: 10 mg/dL (ref 6–20)
CO2: 25 mmol/L (ref 22–32)
Calcium: 9 mg/dL (ref 8.9–10.3)
Chloride: 106 mmol/L (ref 98–111)
Creatinine, Ser: 0.73 mg/dL (ref 0.44–1.00)
GFR, Estimated: >60 mL/min (ref >60)
Glucose, Bld: 102 mg/dL — ABNORMAL HIGH (ref 70–99)
Potassium: 3.2 mmol/L — ABNORMAL LOW (ref 3.5–5.1)
Sodium: 139 mmol/L (ref 135–145)
Total Bilirubin: 0.7 mg/dL (ref 0.3–1.2)
Total Protein: 7.4 g/dL (ref 6.5–8.1)

## 2021-09-06 LAB — CBC
HCT: 38.3 % (ref 36.0–46.0)
Hemoglobin: 12.1 g/dL (ref 12.0–15.0)
MCH: 27.1 pg (ref 26.0–34.0)
MCHC: 31.6 g/dL (ref 30.0–36.0)
MCV: 85.9 fL (ref 80.0–100.0)
Platelets: 271 10*3/uL (ref 150–400)
RBC: 4.46 MIL/uL (ref 3.87–5.11)
RDW: 13 % (ref 11.5–15.5)
WBC: 6 10*3/uL (ref 4.0–10.5)
nRBC: 0 % (ref 0.0–0.2)

## 2021-09-06 LAB — LIPASE, BLOOD: Lipase: 27 U/L (ref 11–51)

## 2021-09-06 LAB — TROPONIN I (HIGH SENSITIVITY): Troponin I (High Sensitivity): <2 ng/L (ref <18)

## 2021-09-06 MED ORDER — METOCLOPRAMIDE HCL 10 MG PO TABS: 10.0000 mg | ORAL_TABLET | Freq: Four times a day (QID) | ORAL | 0 refills | Status: DC | PRN

## 2021-09-06 MED ORDER — FAMOTIDINE 20 MG PO TABS: 20.0000 mg | ORAL_TABLET | Freq: Two times a day (BID) | ORAL | 0 refills | Status: AC

## 2021-09-06 NOTE — ED Provider Notes (Signed)
? ?[facility] ?Provider Note ? ? ? Event Date/Time  ? First MD Initiated Contact with Patient 09/06/21 1501   ?  (approximate) ? ? ?History  ? ?Chest Pain ? ? ?HPI ? ?Mckenzie Lozano is a 51 y.o. female with a past history of hypertension and GERD who comes ED complaining of left upper abdominal pain which radiates to her left mid back.  Worse with acidic foods such as tomatoes and onions.  Better after taking vinegar or Tums but then reoccurs.  Has been ongoing for the past 2 weeks. ? ?She was previously on ranitidine, but when this was removed from the market, she has only been taking Tums intermittently for her GERD. ?  ? ? ?Physical Exam  ? ?Triage Vital Signs: ?ED Triage Vitals [09/06/21 1107]  ?Enc Vitals Group  ?   BP 127/78  ?   Pulse Rate 95  ?   Resp 20  ?   Temp 99.1 ?F (37.3 ?C)  ?   Temp Source Oral  ?   SpO2 99 %  ?   Weight 188 lb (85.3 kg)  ?   Height 5\' 2"  (1.575 m)  ?   Head Circumference   ?   Peak Flow   ?   Pain Score 9  ?   Pain Loc   ?   Pain Edu?   ?   Excl. in [place]?   ? ? ?Most recent vital signs: ?Vitals:  ? 09/06/21 1107 09/06/21 1524  ?BP: 127/78 131/75  ?Pulse: 95 (!) 58  ?Resp: 20 18  ?Temp: 99.1 ?F (37.3 ?C)   ?SpO2: 99% 100%  ? ? ? ?General: Awake, no distress.  ?CV:  Good peripheral perfusion.  Regular rate and rhythm ?Resp:  Normal effort.  Clear to auscultation bilaterally ?Abd:  No distention.  Mild left upper quadrant tenderness reproducing her pain ?Other:  No lower extremity edema, no calf tenderness.  Normal peripheral pulses. ? ? ?ED Results / Procedures / Treatments  ? ?Labs ?(all labs ordered are listed, but only abnormal results are displayed) ?Labs Reviewed  ?COMPREHENSIVE METABOLIC PANEL - Abnormal; Notable for the following components:  ?    Result Value  ? Potassium 3.2 (*)   ? Glucose, Bld 102 (*)   ? All other components within normal limits  ?URINALYSIS, COMPLETE (UACMP) WITH MICROSCOPIC - Abnormal; Notable for the following components:  ?  Color, Urine YELLOW (*)   ? APPearance CLEAR (*)   ? Hgb urine dipstick SMALL (*)   ? All other components within normal limits  ?CBC  ?LIPASE, BLOOD  ?TROPONIN I (HIGH SENSITIVITY)  ?TROPONIN I (HIGH SENSITIVITY)  ? ? ? ?EKG ? ?Interpreted by me ?Normal sinus rhythm rate of 92.  Normal axis and intervals.  Poor R wave progression.  Normal ST segments and T waves.  No ischemic changes. ? ? ?RADIOLOGY ? ? ? ? ?PROCEDURES: ? ?Critical Care performed: No ? ?Procedures ? ? ?MEDICATIONS ORDERED IN ED: ?Medications - No data to display ? ? ?IMPRESSION / MDM / ASSESSMENT AND PLAN / ED COURSE  ?I reviewed the triage vital signs and the nursing notes. ?             ?               ? ?Patient presents with left upper quadrant abdominal pain, exam consistent with gastritis, symptoms consistent with GERD.   Considering the patient's symptoms, medical history, and physical  examination today, I have low suspicion for ACS, PE, TAD, pneumothorax, carditis, mediastinitis, pneumonia, CHF, or sepsis. ? ? ?EKG and initial lab work-up is all normal.  With the chronicity of symptoms this is very reassuring and she does not require admission or further work-up.  Will prescribe Pepcid and Reglan, follow-up with primary care. ? ? ? ? ?  ? ? ?FINAL CLINICAL IMPRESSION(S) / ED DIAGNOSES  ? ?Final diagnoses:  ?Acute gastritis without hemorrhage, unspecified gastritis type  ? ? ? ?Rx / DC Orders  ? ?ED Discharge Orders   ? ?      Ordered  ?  famotidine (PEPCID) 20 MG tablet  2 times daily       ? 09/06/21 1624  ?  metoCLOPramide (REGLAN) 10 MG tablet  Every 6 hours PRN       ? 09/06/21 1624  ? ?  ?  ? ?  ? ? ? ?Note:  This document was prepared using Dragon voice recognition software and may include unintentional dictation errors. ?  ?Carrie Mew, MD ?09/06/21 1630 ? ?

## 2021-09-06 NOTE — ED Triage Notes (Signed)
Pt in with co left sided chest pain, left mid abd pain with radiation to left back. Pt states it started over a week ago with diarrhea.  ?

## 2021-11-14 ENCOUNTER — Emergency Department
Admission: EM | Admit: 2021-11-14 | Discharge: 2021-11-14 | Discharge status: Against Medical Advice | Payer: Managed Care, Other (non HMO) | Attending: Emergency Medicine | Admitting: Emergency Medicine

## 2021-11-14 ENCOUNTER — Emergency Department: Payer: Managed Care, Other (non HMO)

## 2021-11-14 DIAGNOSIS — R1013 Epigastric pain: Secondary | ICD-10-CM | POA: Diagnosis not present

## 2021-11-14 DIAGNOSIS — R112 Nausea with vomiting, unspecified: Secondary | ICD-10-CM | POA: Insufficient documentation

## 2021-11-14 DIAGNOSIS — R1012 Left upper quadrant pain: Secondary | ICD-10-CM | POA: Diagnosis not present

## 2021-11-14 LAB — TYPE AND SCREEN
ABO/RH(D): O POS
Antibody Screen: NEGATIVE

## 2021-11-14 LAB — COMPREHENSIVE METABOLIC PANEL
ALT: 23 U/L (ref 0–44)
AST: 22 U/L (ref 15–41)
Albumin: 3.7 g/dL (ref 3.5–5.0)
Alkaline Phosphatase: 84 U/L (ref 38–126)
Anion gap: 5 (ref 5–15)
BUN: 13 mg/dL (ref 6–20)
CO2: 26 mmol/L (ref 22–32)
Calcium: 8.9 mg/dL (ref 8.9–10.3)
Chloride: 103 mmol/L (ref 98–111)
Creatinine, Ser: 0.78 mg/dL (ref 0.44–1.00)
GFR, Estimated: >60 mL/min (ref >60)
Glucose, Bld: 98 mg/dL (ref 70–99)
Potassium: 3.5 mmol/L (ref 3.5–5.1)
Sodium: 134 mmol/L — ABNORMAL LOW (ref 135–145)
Total Bilirubin: 0.8 mg/dL (ref 0.3–1.2)
Total Protein: 7.4 g/dL (ref 6.5–8.1)

## 2021-11-14 LAB — CBC
HCT: 39.6 % (ref 36.0–46.0)
Hemoglobin: 12.7 g/dL (ref 12.0–15.0)
MCH: 27.7 pg (ref 26.0–34.0)
MCHC: 32.1 g/dL (ref 30.0–36.0)
MCV: 86.5 fL (ref 80.0–100.0)
Platelets: 258 10*3/uL (ref 150–400)
RBC: 4.58 MIL/uL (ref 3.87–5.11)
RDW: 12.9 % (ref 11.5–15.5)
WBC: 7.2 10*3/uL (ref 4.0–10.5)
nRBC: 0 % (ref 0.0–0.2)

## 2021-11-14 NOTE — ED Notes (Signed)
RN went in to check on patient and patient had left AMA ?

## 2021-11-14 NOTE — ED Provider Notes (Signed)
? ?[facility] ?Provider Note ? ? ? Event Date/Time  ? First MD Initiated Contact with Patient 11/14/21 2151   ?  (approximate) ? ? ?History  ? ?Abdominal Pain ? ? ?HPI ? ?Mckenzie Lozano is a 51 y.o. female  who presents to the emergency department today because of concerns for abdominal pain nausea and vomiting.  Patient states that the pain has been present for some time but is gotten worse over the past 2 weeks.  Located in the epigastric and left upper quadrant.  It is worse after she tries to eat.  Additionally she feels nauseous every time she eats.  She was seen in the emergency department roughly 2 months ago for similar symptoms.  She states she tried taking one of the medications prescribed to her without any significant relief.  Patient additionally has complaints of dark stool over the past 2 weeks although she says it is now getting better.  She denies any fevers.  Her brother recently had his gallbladder removed. ? ?Physical Exam  ? ?Triage Vital Signs: ?ED Triage Vitals  ?Enc Vitals Group  ?   BP 11/14/21 1739 (!) 164/91  ?   Pulse Rate 11/14/21 1739 78  ?   Resp 11/14/21 1739 17  ?   Temp 11/14/21 1739 98.6 ?F (37 ?C)  ?   Temp Source 11/14/21 1739 Oral  ?   SpO2 11/14/21 1739 98 %  ?   Weight 11/14/21 1740 191 lb (86.6 kg)  ?   Height 11/14/21 1740 '5\' 2"'$  (1.575 m)  ?   Head Circumference --   ?   Peak Flow --   ?   Pain Score 11/14/21 1740 10  ? ?Most recent vital signs: ?Vitals:  ? 11/14/21 1739 11/14/21 2145  ?BP: (!) 164/91 136/90  ?Pulse: 78 (!) 57  ?Resp: 17 18  ?Temp: 98.6 ?F (37 ?C) 97.9 ?F (36.6 ?C)  ?SpO2: 98% 100%  ? ? ?General: Awake, alert and oriented. ?CV:  Good peripheral perfusion. Regular rate and rhythm. ?Resp:  Normal effort. Lungs clear. ?Abd:  No distention. Tender to epigastric and LUQ. ? ?ED Results / Procedures / Treatments  ? ?Labs ?(all labs ordered are listed, but only abnormal results are displayed) ?Labs Reviewed  ?COMPREHENSIVE METABOLIC PANEL -  Abnormal; Notable for the following components:  ?    Result Value  ? Sodium 134 (*)   ? All other components within normal limits  ?CBC  ?POC OCCULT BLOOD, ED  ?POC URINE PREG, ED  ?TYPE AND SCREEN  ? ? ? ?EKG ? ?INance Pear, attending physician, personally viewed and interpreted this EKG ? ?EKG Time: 1739 ?Rate: 75 ?Rhythm: normal sinus rhythm ?Axis: normal ?Intervals: qtc 442 ?QRS: incomplete RBBB ?ST changes: no st elevation ?Impression: abnormal ekg ? ?RADIOLOGY ?[place] pending at time of elopement.  ? ?PROCEDURES: ? ?Critical Care performed: No ? ?Procedures ? ? ?MEDICATIONS ORDERED IN ED: ?Medications - No data to display ? ? ?IMPRESSION / MDM / ASSESSMENT AND PLAN / ED COURSE  ?I reviewed the triage vital signs and the nursing notes. ?             ?               ? ?Differential diagnosis includes, but is not limited to, gastritis, gallbladder disease. ?Patient presents to the emergency department today because of concerns for epigastric and left upper quadrant abdominal pain that is worse after eating.  On  exam she is tender epigastric and some tenderness in the left upper quadrant.  I think GERD is likely I did want to check a right upper quadrant ultrasound given family history of gallbladder disease.  Patient left the emergency department prior to ultrasound results. ? ?FINAL CLINICAL IMPRESSION(S) / ED DIAGNOSES  ? ?Final diagnoses:  ?Epigastric pain  ? ? ? ? ?Note:  This document was prepared using Dragon voice recognition software and may include unintentional dictation errors. ? ?  ?Nance Pear, MD ?11/15/21 0009 ? ?

## 2021-11-14 NOTE — ED Notes (Signed)
Patient stated she wanted to cancel [place] and leave AMA and would just follow up with her PCP at appointment in August. [place] tech came to room to perform scan in the midst of RN waiting to speak with Archie Balboa, MD about patient wanting to leave. Patient agreed to have scan completed. ?

## 2021-11-14 NOTE — ED Triage Notes (Signed)
Patient to ER via [facility] with complaints of LUQ abdominal pain. Reports the pain has been ongoing for the last two weeks. Also reports nausea/ vomiting. Pain relieved when applying pressure. Patient also states that the last two weeks she has had dark/ tarry stool, this weak reports stool is pale in color.  ? ?Denies hx of abdominal surgery.  ?

## 2021-11-15 NOTE — ED Notes (Signed)
Reviewed results returned after ed discharge--ultrasound.  Shows changes since 2017 and recommends CT scan.  [facility] internal med.  Patient is changing pcp from [place] to dr Tressia Miners at [facility], but has not been seen at [facility] yet.  She spoke with dr Wyatt Portela nurse and said they would have md review and decide if they should get her in sooner than appt in august.  They will follow up with the patient. ?

## 2022-02-06 ENCOUNTER — Ambulatory Visit (INDEPENDENT_AMBULATORY_CARE_PROVIDER_SITE_OTHER): Payer: Managed Care, Other (non HMO) | Admitting: Obstetrics and Gynecology

## 2022-02-06 ENCOUNTER — Encounter: Payer: Self-pay | Admitting: Obstetrics and Gynecology

## 2022-02-06 VITALS — BP 110/70 | HR 61 | Resp 16 | Ht 62.0 in | Wt 192.4 lb

## 2022-02-06 DIAGNOSIS — Z01419 Encounter for gynecological examination (general) (routine) without abnormal findings: Secondary | ICD-10-CM | POA: Diagnosis not present

## 2022-02-06 DIAGNOSIS — Z1211 Encounter for screening for malignant neoplasm of colon: Secondary | ICD-10-CM | POA: Diagnosis not present

## 2022-02-06 DIAGNOSIS — Z1231 Encounter for screening mammogram for malignant neoplasm of breast: Secondary | ICD-10-CM

## 2022-02-06 DIAGNOSIS — Z124 Encounter for screening for malignant neoplasm of cervix: Secondary | ICD-10-CM

## 2022-02-06 NOTE — Progress Notes (Signed)
PCP:  Enid Baas, MD   Chief Complaint  Patient presents with   Annual Exam     HPI:      Mckenzie Lozano is a 51 y.o. G2P0020 who LMP was Patient's last menstrual period was 08/16/2020 (approximate)., presents today for her annual examination.  Her menses are absent for over a year now, no PMB. Having terrible vasomotor sx  Sex activity: no longer sex active. No vag dryness/sx.  Pt had LLQ pain with sex for the past few yrs but no pain if wasn't sex active. Has a hx of pelvic pain with neg GYN u/s in past and neg pelvic CT 2017. Neg GYN u/s 2/21.  Last Pap: 09/17/20 Results were: no abnormalities /neg HPV DNA. Likes yearly paps for insurance reimbursement. Hx of STDs: HSV, no recent sx   Last mammogram: 04/26/21 at [facility]; Results were: normal after addl imaging for breast pain/mass LT breast--routine follow-up in 12 months There is a FH of breast cancer in her mat aunt. There is no FH of ovarian cancer. There is a FH of prostate cancer in her brother at age 39 and pancreatic cancer in her MGF. Pt is MyRisk neg 3/21; IBIS=9.2%. The patient does do self-breast exams.  Tobacco use: The patient denies current or previous tobacco use. Alcohol use: none No drug use.  Exercise: moderately active  She does get adequate calcium and Vitamin D in her diet.  Labs with PCP.  Colonoscopy 2017 at [facility]. Due again after 5 yrs due to personal and FH polyps; has appt scheduled 8/23. Being treated for stomach ulcers with GI currently.    Past Medical History:  Diagnosis Date   Anemia    Anovulation 11/30/2013   BRCA negative 09/2019   MyRisk neg; IBIS-9.2%   Colon polyps    Dizziness    Family history of pancreatic cancer    Heart murmur    Herpes genitalis    History of hiatal hernia    History of ovarian cyst 02/2011   right   Hypertension    IBS (irritable bowel syndrome)    Migraine    PONV (postoperative nausea and vomiting)    Pre-diabetes     Past Surgical  History:  Procedure Laterality Date   COLONOSCOPY  10/2015   polyp, hemorrhoids; repeat in 5 yrs   DILATION AND CURETTAGE OF UTERUS     HAMMER TOE SURGERY Left 10/02/2017   Procedure: HAMMER TOE CORRECTION;  Surgeon: Linus Galas, DPM;  Location: [facility] ORS;  Service: Podiatry;  Laterality: Left;   LAPAROSCOPY  1999   hiatal hernia   URETHRA SURGERY  05/2012   WEIL OSTEOTOMY Left 10/02/2017   Procedure: WEIL OSTEOTOMY/2nd met osteotomy;  Surgeon: Linus Galas, DPM;  Location: [facility] ORS;  Service: Podiatry;  Laterality: Left;    Family History  Problem Relation Age of Onset   Hypertension Mother    Diabetes Brother    Hyperlipidemia Brother    Heart attack Brother    Prostate cancer Brother 39   Pancreatic cancer Maternal Grandfather 84   Breast cancer Maternal Aunt 60   Prostate cancer Father 66   Cancer Maternal Aunt        lymph    Social History   Socioeconomic History   Marital status: Single    Spouse name: Not on file   Number of children: Not on file   Years of education: Not on file   Highest education level: Not on file  Occupational History   Not on file  Tobacco Use   Smoking status: Never   Smokeless tobacco: Never  Vaping Use   Vaping Use: Never used  Substance and Sexual Activity   Alcohol use: Yes    Alcohol/week: 0.0 standard drinks of alcohol   Drug use: No   Sexual activity: Not Currently    Birth control/protection: None  Other Topics Concern   Not on file  Social History Narrative   Not on file   Social Determinants of Health   Financial Resource Strain: Not on file  Food Insecurity: Not on file  Transportation Needs: Not on file  Physical Activity: Insufficiently Active (08/13/2017)   Exercise Vital Sign    Days of Exercise per Week: 3 days    Minutes of Exercise per Session: 30 min  Stress: No Stress Concern Present (08/13/2017)   [place]    Feeling of Stress : Only a  little  Social Connections: Moderately Integrated (08/13/2017)   Social Connection and Isolation Panel [NHANES]    Frequency of Communication with Friends and Family: More than three times a week    Frequency of Social Gatherings with Friends and Family: More than three times a week    Attends Religious Services: More than 4 times per year    Active Member of [employer] or Organizations: Yes    Attends Archivist Meetings: More than 4 times per year    Marital Status: Never married  Intimate Partner Violence: Not At Risk (08/13/2017)   Humiliation, Afraid, Rape, and Kick questionnaire    Fear of Current or Ex-Partner: No    Emotionally Abused: No    Physically Abused: No    Sexually Abused: No    Current Meds  Medication Sig   acetaminophen (TYLENOL) 500 MG tablet Take by mouth.   CALCIUM/MAGNESIUM/ZINC FORMULA PO Take 1 tablet by mouth daily.   Cholecalciferol (VITAMIN D-3) 5000 units TABS Take 1 tablet by mouth daily.   famotidine (PEPCID) 20 MG tablet Take 1 tablet (20 mg total) by mouth 2 (two) times daily.   hydrochlorothiazide (HYDRODIURIL) 12.5 MG tablet Take by mouth.   solifenacin (VESICARE) 10 MG tablet Take 1 tablet by mouth daily.   sucralfate (CARAFATE) 1 g tablet Take by mouth.     ROS:  Review of Systems  Constitutional:  Positive for fatigue. Negative for fever and unexpected weight change.  Respiratory:  Negative for cough, shortness of breath and wheezing.   Cardiovascular:  Negative for chest pain, palpitations and leg swelling.  Gastrointestinal:  Positive for constipation and diarrhea. Negative for blood in stool, nausea and vomiting.  Endocrine: Negative for cold intolerance, heat intolerance and polyuria.  Genitourinary:  Positive for frequency. Negative for dyspareunia, dysuria, flank pain, genital sores, hematuria, menstrual problem, pelvic pain, urgency, vaginal bleeding, vaginal discharge and vaginal pain.  Musculoskeletal:  Negative for arthralgias,  back pain, joint swelling and myalgias.  Skin:  Negative for rash.  Neurological:  Positive for headaches. Negative for dizziness, syncope, light-headedness and numbness.  Hematological:  Negative for adenopathy.  Psychiatric/Behavioral:  Negative for agitation, confusion, sleep disturbance and suicidal ideas. The patient is not nervous/anxious.      Objective: BP 110/70   Pulse 61   Resp 16   Ht $R'5\' 2"'go$  (1.575 m)   Wt 192 lb 6.4 oz (87.3 kg)   LMP 08/16/2020 (Approximate)   SpO2 99%   BMI 35.19 kg/m   Physical Exam  Constitutional:      Appearance: She is well-developed.  Genitourinary:     Vulva normal.     Right Labia: No rash, tenderness or lesions.    Left Labia: No tenderness, lesions or rash.    No vaginal discharge, erythema or tenderness.      Right Adnexa: not tender and no mass present.    Left Adnexa: not tender and no mass present.    No cervical friability or polyp.     Uterus is not enlarged or tender.  Breasts:    Right: No mass, nipple discharge, skin change or tenderness.     Left: No mass, nipple discharge, skin change or tenderness.  Neck:     Thyroid: No thyromegaly.  Cardiovascular:     Rate and Rhythm: Normal rate and regular rhythm.     Heart sounds: Normal heart sounds. No murmur heard. Pulmonary:     Effort: Pulmonary effort is normal.     Breath sounds: Normal breath sounds.  Abdominal:     Palpations: Abdomen is soft.     Tenderness: There is no abdominal tenderness. There is no guarding or rebound.  Musculoskeletal:        General: Normal range of motion.     Cervical back: Normal range of motion.  Lymphadenopathy:     Cervical: No cervical adenopathy.  Neurological:     General: No focal deficit present.     Mental Status: She is alert and oriented to person, place, and time.     Cranial Nerves: No cranial nerve deficit.  Skin:    General: Skin is warm and dry.  Psychiatric:        Mood and Affect: Mood normal.        Behavior:  Behavior normal.        Thought Content: Thought content normal.        Judgment: Judgment normal.  Vitals reviewed.      Assessment/Plan: Encounter for annual routine gynecological examination  Cervical cancer screening - Plan: IGP, rfx Aptima HPV ASCU; pt likes yearly, [employer] employee  Encounter for screening mammogram for malignant neoplasm of breast - Plan: MM 3D SCREEN BREAST BILATERAL; pt to schedule mammo  Screening for colon cancer--has appt 8/23  GYN counsel breast self exam, mammography screening, menopause, adequate intake of calcium and vitamin D, diet and exercise     F/U  Return in about 1 year (around 02/07/2023).  Sarha Bartelt B. Denim Start, PA-C 02/06/2022 3:52 PM

## 2022-02-06 NOTE — Patient Instructions (Addendum)
I value your feedback and you entrusting us with your care. If you get a [place] patient survey, I would appreciate you taking the time to let us know about your experience today. Thank you!   [facility]: [phone]  

## 2022-02-11 LAB — IGP, RFX APTIMA HPV ASCU

## 2022-02-24 ENCOUNTER — Other Ambulatory Visit: Payer: Self-pay | Admitting: Nurse Practitioner

## 2022-02-24 DIAGNOSIS — K760 Fatty (change of) liver, not elsewhere classified: Secondary | ICD-10-CM

## 2022-02-24 DIAGNOSIS — D1803 Hemangioma of intra-abdominal structures: Secondary | ICD-10-CM

## 2022-03-19 ENCOUNTER — Ambulatory Visit
Admission: RE | Admit: 2022-03-19 | Discharge: 2022-03-19 | Disposition: A | Discharge status: Home / Self Care | Payer: Managed Care, Other (non HMO) | Source: Ambulatory Visit | Attending: Nurse Practitioner | Admitting: Nurse Practitioner

## 2022-03-19 DIAGNOSIS — D1803 Hemangioma of intra-abdominal structures: Secondary | ICD-10-CM | POA: Diagnosis present

## 2022-03-19 DIAGNOSIS — K760 Fatty (change of) liver, not elsewhere classified: Secondary | ICD-10-CM | POA: Insufficient documentation

## 2022-03-19 LAB — POCT I-STAT CREATININE: Creatinine, Ser: 0.8 mg/dL (ref 0.44–1.00)

## 2022-03-19 MED ORDER — IOHEXOL 300 MG/ML  SOLN
100.0000 mL | Freq: Once | INTRAMUSCULAR | Status: AC | PRN
  Administered 2022-03-19: 100 mL via INTRAVENOUS

## 2022-04-02 ENCOUNTER — Encounter: Payer: Self-pay | Admitting: Obstetrics and Gynecology

## 2023-02-16 NOTE — Progress Notes (Unsigned)
PCP:  Enid Baas, MD   No chief complaint on file.    HPI:      [name] is a 52 y.o. G2P0020 who LMP was Patient's last menstrual period was 08/16/2020 (approximate)., presents today for her annual examination.  Her menses are absent for over a year now, no PMB. Having terrible vasomotor sx  Sex activity: no longer sex active. No vag dryness/sx.  Pt had LLQ pain with sex for the past few yrs but no pain if wasn't sex active. Has a hx of pelvic pain with neg GYN u/s in past and neg pelvic CT 2017. Neg GYN u/s 2/21.  Last Pap: 02/06/22 Results were: no abnormalities /neg HPV DNA 2022. Likes yearly paps for insurance reimbursement. Hx of STDs: HSV, no recent sx   Last mammogram: 05/15/22 at [facility]; Results were: normal--routine follow-up in 12 months There is a FH of breast cancer in her mat aunt. There is no FH of ovarian cancer. There is a FH of prostate cancer in her brother at age 60 and pancreatic cancer in her MGF. Pt is MyRisk neg 3/21; IBIS=9.2%. The patient does do self-breast exams.  Tobacco use: The patient denies current or previous tobacco use. Alcohol use: none No drug use.  Exercise: moderately active  She does get adequate calcium and Vitamin D in her diet.  Labs with PCP.  Colonoscopy 2017 at [facility]. Due again after 5 yrs due to personal and FH polyps; has appt scheduled 8/23. Being treated for stomach ulcers with GI currently.    Past Medical History:  Diagnosis Date   Anemia    Anovulation 11/30/2013   BRCA negative 09/2019   MyRisk neg; IBIS-9.2%   Colon polyps    Dizziness    Family history of pancreatic cancer    Heart murmur    Herpes genitalis    History of hiatal hernia    History of ovarian cyst 02/2011   right   Hypertension    IBS (irritable bowel syndrome)    Migraine    PONV (postoperative nausea and vomiting)    Pre-diabetes     Past Surgical History:  Procedure Laterality Date   COLONOSCOPY  10/2015   polyp,  hemorrhoids; repeat in 5 yrs   DILATION AND CURETTAGE OF UTERUS     HAMMER TOE SURGERY Left 10/02/2017   Procedure: HAMMER TOE CORRECTION;  Surgeon: Linus Galas, DPM;  Location: [facility] ORS;  Service: Podiatry;  Laterality: Left;   LAPAROSCOPY  1999   hiatal hernia   URETHRA SURGERY  05/2012   WEIL OSTEOTOMY Left 10/02/2017   Procedure: WEIL OSTEOTOMY/2nd met osteotomy;  Surgeon: Linus Galas, DPM;  Location: [facility] ORS;  Service: Podiatry;  Laterality: Left;    Family History  Problem Relation Age of Onset   Hypertension Mother    Diabetes Brother    Hyperlipidemia Brother    Heart attack Brother    Prostate cancer Brother 18   Pancreatic cancer Maternal Grandfather 55   Breast cancer Maternal Aunt 60   Prostate cancer Father 53   Cancer Maternal Aunt        lymph    Social History   Socioeconomic History   Marital status: Single    Spouse name: Not on file   Number of children: Not on file   Years of education: Not on file   Highest education level: Not on file  Occupational History   Not on file  Tobacco Use   Smoking  status: Never   Smokeless tobacco: Never  Vaping Use   Vaping status: Never Used  Substance and Sexual Activity   Alcohol use: Yes    Alcohol/week: 0.0 standard drinks of alcohol   Drug use: No   Sexual activity: Not Currently    Birth control/protection: None  Other Topics Concern   Not on file  Social History Narrative   Not on file   Social Determinants of Health   Financial Resource Strain: Low Risk  (01/28/2023)   Received from [facility]   Overall Financial Resource Strain (CARDIA)    Difficulty of Paying Living Expenses: Not hard at all  Food Insecurity: No Food Insecurity (01/28/2023)   Received from [facility]   Hunger Vital Sign    Worried About Running Out of Food in the Last Year: Never true    Ran Out of Food in the Last Year: Never true  Transportation Needs: No Transportation Needs (01/28/2023)    Received from [facility] - Transportation    In the past 12 months, has lack of transportation kept you from medical appointments or from getting medications?: No    Lack of Transportation (Non-Medical): No  Physical Activity: Unknown (09/25/2017)   Received from [facility]   Exercise Vital Sign    Days of Exercise per Week: Patient declined    Minutes of Exercise per Session: Patient declined  Recent Concern: Physical Activity - Insufficiently Active (08/13/2017)   Exercise Vital Sign    Days of Exercise per Week: 3 days    Minutes of Exercise per Session: 30 min  Stress: Unknown (09/25/2017)   Received from [facility] - Occupational Stress Questionnaire    Feeling of Stress : Patient declined  Social Connections: Unknown (09/25/2017)   Received from [facility]   Social Connection and Isolation Panel [NHANES]    Frequency of Communication with Friends and Family: Patient declined    Frequency of Social Gatherings with Friends and Family: Patient declined    Attends Religious Services: Patient declined    Active Member of Clubs or Organizations: Patient declined    Attends Banker Meetings: Patient declined    Marital Status: Patient declined  Intimate Partner Violence: Not At Risk (08/13/2017)   Humiliation, Afraid, Rape, and Kick questionnaire    Fear of Current or Ex-Partner: No    Emotionally Abused: No    Physically Abused: No    Sexually Abused: No    No outpatient medications have been marked as taking for the 02/17/23 encounter (Appointment) with Noelia Lenart B, PA-C.     ROS:  Review of Systems  Constitutional:  Positive for fatigue. Negative for fever and unexpected weight change.  Respiratory:  Negative for cough, shortness of breath and wheezing.   Cardiovascular:  Negative for chest pain, palpitations and leg swelling.   Gastrointestinal:  Positive for constipation and diarrhea. Negative for blood in stool, nausea and vomiting.  Endocrine: Negative for cold intolerance, heat intolerance and polyuria.  Genitourinary:  Positive for frequency. Negative for dyspareunia, dysuria, flank pain, genital sores, hematuria, menstrual problem, pelvic pain, urgency, vaginal bleeding, vaginal discharge and vaginal pain.  Musculoskeletal:  Negative for arthralgias, back pain, joint swelling and myalgias.  Skin:  Negative for rash.  Neurological:  Positive for headaches. Negative for dizziness, syncope, light-headedness and numbness.  Hematological:  Negative for adenopathy.  Psychiatric/Behavioral:  Negative for agitation, confusion, sleep disturbance and suicidal ideas. The patient is not nervous/anxious.      Objective: LMP 08/16/2020 (Approximate)   Physical Exam Constitutional:      Appearance: She is well-developed.  Genitourinary:     Vulva normal.     Right Labia: No rash, tenderness or lesions.    Left Labia: No tenderness, lesions or rash.    No vaginal discharge, erythema or tenderness.      Right Adnexa: not tender and no mass present.    Left Adnexa: not tender and no mass present.    No cervical friability or polyp.     Uterus is not enlarged or tender.  Breasts:    Right: No mass, nipple discharge, skin change or tenderness.     Left: No mass, nipple discharge, skin change or tenderness.  Neck:     Thyroid: No thyromegaly.  Cardiovascular:     Rate and Rhythm: Normal rate and regular rhythm.     Heart sounds: Normal heart sounds. No murmur heard. Pulmonary:     Effort: Pulmonary effort is normal.     Breath sounds: Normal breath sounds.  Abdominal:     Palpations: Abdomen is soft.     Tenderness: There is no abdominal tenderness. There is no guarding or rebound.  Musculoskeletal:        General: Normal range of motion.     Cervical back: Normal range of motion.  Lymphadenopathy:      Cervical: No cervical adenopathy.  Neurological:     General: No focal deficit present.     Mental Status: She is alert and oriented to person, place, and time.     Cranial Nerves: No cranial nerve deficit.  Skin:    General: Skin is warm and dry.  Psychiatric:        Mood and Affect: Mood normal.        Behavior: Behavior normal.        Thought Content: Thought content normal.        Judgment: Judgment normal.  Vitals reviewed.      Assessment/Plan: Encounter for annual routine gynecological examination  Cervical cancer screening - Plan: IGP, rfx Aptima HPV ASCU; pt likes yearly, [employer] employee  Encounter for screening mammogram for malignant neoplasm of breast - Plan: MM 3D SCREEN BREAST BILATERAL; pt to schedule mammo  Screening for colon cancer--has appt 8/23  GYN counsel breast self exam, mammography screening, menopause, adequate intake of calcium and vitamin D, diet and exercise     F/U  No follow-ups on file.  Tyrae Alcoser B. Keola Heninger, PA-C 02/16/2023 4:34 PM

## 2023-02-17 ENCOUNTER — Encounter: Payer: Self-pay | Admitting: Obstetrics and Gynecology

## 2023-02-17 ENCOUNTER — Ambulatory Visit (INDEPENDENT_AMBULATORY_CARE_PROVIDER_SITE_OTHER): Payer: Managed Care, Other (non HMO) | Admitting: Obstetrics and Gynecology

## 2023-02-17 VITALS — BP 142/80 | Ht 62.0 in | Wt 192.0 lb

## 2023-02-17 DIAGNOSIS — Z124 Encounter for screening for malignant neoplasm of cervix: Secondary | ICD-10-CM

## 2023-02-17 DIAGNOSIS — Z1231 Encounter for screening mammogram for malignant neoplasm of breast: Secondary | ICD-10-CM

## 2023-02-17 DIAGNOSIS — Z1211 Encounter for screening for malignant neoplasm of colon: Secondary | ICD-10-CM

## 2023-02-17 DIAGNOSIS — R1032 Left lower quadrant pain: Secondary | ICD-10-CM

## 2023-02-17 DIAGNOSIS — Z01419 Encounter for gynecological examination (general) (routine) without abnormal findings: Secondary | ICD-10-CM

## 2023-02-17 DIAGNOSIS — N950 Postmenopausal bleeding: Secondary | ICD-10-CM

## 2023-02-17 NOTE — Patient Instructions (Addendum)
I value your feedback and you entrusting us with your care. If you get a [facility] patient survey, I would appreciate you taking the time to let us know about your experience today. Thank you!   [facility]: [phone]  

## 2023-02-18 ENCOUNTER — Ambulatory Visit: Payer: Managed Care, Other (non HMO)

## 2023-02-18 DIAGNOSIS — R1032 Left lower quadrant pain: Secondary | ICD-10-CM | POA: Diagnosis not present

## 2023-02-18 DIAGNOSIS — N950 Postmenopausal bleeding: Secondary | ICD-10-CM

## 2023-02-27 LAB — IGP, RFX APTIMA HPV ASCU

## 2023-03-04 NOTE — Progress Notes (Unsigned)
Mckenzie Baas, MD   No chief complaint on file.   HPI:      [name] is a 52 y.o. G2P0020 whose LMP was Patient's last menstrual period was 08/16/2020 (approximate)., presents today for ***    Patient Active Problem List   Diagnosis Date Noted   Mid back pain on left side 09/17/2020   Family history of breast cancer 08/25/2019   Family history of pancreatic cancer 08/25/2019   LLQ pain 08/25/2019    Past Surgical History:  Procedure Laterality Date   COLONOSCOPY  10/2015   polyp, hemorrhoids; repeat in 5 yrs   DILATION AND CURETTAGE OF UTERUS     HAMMER TOE SURGERY Left 10/02/2017   Procedure: HAMMER TOE CORRECTION;  Surgeon: Linus Galas, DPM;  Location: [facility];  Service: Podiatry;  Laterality: Left;   LAPAROSCOPY  1999   hiatal hernia   URETHRA SURGERY  05/2012   WEIL OSTEOTOMY Left 10/02/2017   Procedure: WEIL OSTEOTOMY/2nd met osteotomy;  Surgeon: Linus Galas, DPM;  Location: [facility];  Service: Podiatry;  Laterality: Left;    Family History  Problem Relation Age of Onset   Hypertension Mother    Diabetes Brother    Hyperlipidemia Brother    Heart attack Brother    Prostate cancer Brother 6   Pancreatic cancer Maternal Grandfather 28   Breast cancer Maternal Aunt 60   Prostate cancer Father 77   Cancer Maternal Aunt        lymph    Social History   Socioeconomic History   Marital status: Single    Spouse name: Not on file   Number of children: Not on file   Years of education: Not on file   Highest education level: Not on file  Occupational History   Not on file  Tobacco Use   Smoking status: Never   Smokeless tobacco: Never  Vaping Use   Vaping status: Never Used  Substance and Sexual Activity   Alcohol use: Yes    Alcohol/week: 0.0 standard drinks of alcohol   Drug use: No   Sexual activity: Not Currently    Birth control/protection: None  Other Topics Concern   Not on file  Social History Narrative   Not on file    Social Determinants of Health   Financial Resource Strain: Low Risk  (01/28/2023)   Received from [facility]   Overall Financial Resource Strain (CARDIA)    Difficulty of Paying Living Expenses: Not hard at all  Food Insecurity: No Food Insecurity (01/28/2023)   Received from [facility]   Hunger Vital Sign    Worried About Running Out of Food in the Last Year: Never true    Ran Out of Food in the Last Year: Never true  Transportation Needs: No Transportation Needs (01/28/2023)   Received from [facility] - Transportation    In the past 12 months, has lack of transportation kept you from medical appointments or from getting medications?: No    Lack of Transportation (Non-Medical): No  Physical Activity: Unknown (09/25/2017)   Received from [facility]   Exercise Vital Sign    Days of Exercise per Week: Patient declined    Minutes of Exercise per Session: Patient declined  Recent Concern: Physical Activity - Insufficiently Active (08/13/2017)   Exercise Vital Sign    Days of Exercise per Week: 3 days    Minutes of Exercise per Session:  30 min  Stress: Unknown (09/25/2017)   Received from [facility]ational Health - Occupational Stress Questionnaire    Feeling of Stress : Patient declined  Social Connections: Unknown (09/25/2017)   Received from [facility]   Social Connection and Isolation Panel [NHANES]    Frequency of Communication with Friends and Family: Patient declined    Frequency of Social Gatherings with Friends and Family: Patient declined    Attends Religious Services: Patient declined    Database administrator or Organizations: Patient declined    Attends Banker Meetings: Patient declined    Marital Status: Patient declined  Intimate Partner Violence: Not At Risk (08/13/2017)   Humiliation, Afraid, Rape, and Kick  questionnaire    Fear of Current or Ex-Partner: No    Emotionally Abused: No    Physically Abused: No    Sexually Abused: No    Outpatient Medications Prior to Visit  Medication Sig Dispense Refill   acetaminophen (TYLENOL) 500 MG tablet Take by mouth.     amLODipine (NORVASC) 2.5 MG tablet Take by mouth.     CALCIUM/MAGNESIUM/ZINC FORMULA PO Take 1 tablet by mouth daily.     Cholecalciferol (VITAMIN D-3) 5000 units TABS Take 1 tablet by mouth daily.     famotidine (PEPCID) 20 MG tablet Take 1 tablet (20 mg total) by mouth 2 (two) times daily. 60 tablet 0   No facility-administered medications prior to visit.      ROS:  Review of Systems BREAST: No symptoms   OBJECTIVE:   Vitals:  LMP 08/16/2020 (Approximate)   Physical Exam  Results: No results found for this or any previous visit (from the past 24 hour(s)).    Endometrial Biopsy After discussion with the patient regarding her abnormal uterine bleeding I recommended that she proceed with an endometrial biopsy for further diagnosis. The risks, benefits, alternatives, and indications for an endometrial biopsy were discussed with the patient in detail. She understood the risks including infection, bleeding, cervical laceration and uterine perforation.  Verbal consent was obtained.   PROCEDURE NOTE:  Pipelle endometrial biopsy was performed using aseptic technique with iodine preparation.  The uterus was sounded to a length of *** cm.  Adequate sampling {Actions; was/was not:31712} obtained with minimal blood loss.  The patient tolerated the procedure well.  Disposition will be pending pathology.  Mckenzie Lozano B. Mckenzie Sestak, PA-C [facility] 03/04/2023  9:40 PM   Assessment/Plan: No diagnosis found.    No orders of the defined types were placed in this encounter.     No follow-ups on file.  Mckenzie Lozano B. Mckenzie Hackert, PA-C 03/04/2023 9:39 PM

## 2023-03-05 ENCOUNTER — Ambulatory Visit: Payer: Managed Care, Other (non HMO) | Admitting: Obstetrics and Gynecology

## 2023-03-05 ENCOUNTER — Encounter: Payer: Self-pay | Admitting: Obstetrics and Gynecology

## 2023-03-05 VITALS — BP 124/70 | Ht 62.0 in | Wt 192.0 lb

## 2023-03-05 DIAGNOSIS — R1032 Left lower quadrant pain: Secondary | ICD-10-CM | POA: Diagnosis not present

## 2023-03-05 DIAGNOSIS — N950 Postmenopausal bleeding: Secondary | ICD-10-CM

## 2023-03-05 NOTE — Patient Instructions (Signed)
I value your feedback and you entrusting us with your care. If you get a [facility] patient survey, I would appreciate you taking the time to let us know about your experience today. Thank you! ? ? ?

## 2023-03-10 LAB — ANATOMIC PATHOLOGY REPORT

## 2023-05-18 LAB — HM MAMMOGRAPHY

## 2023-05-19 ENCOUNTER — Encounter: Payer: Self-pay | Admitting: Obstetrics and Gynecology

## 2023-05-26 ENCOUNTER — Encounter: Payer: Self-pay | Admitting: Urology

## 2023-05-26 ENCOUNTER — Ambulatory Visit: Payer: Managed Care, Other (non HMO) | Admitting: Urology

## 2023-05-26 VITALS — BP 155/83 | HR 105 | Ht 62.0 in | Wt 192.0 lb

## 2023-05-26 DIAGNOSIS — G8929 Other chronic pain: Secondary | ICD-10-CM

## 2023-05-26 DIAGNOSIS — N3946 Mixed incontinence: Secondary | ICD-10-CM

## 2023-05-26 DIAGNOSIS — N3281 Overactive bladder: Secondary | ICD-10-CM

## 2023-05-26 DIAGNOSIS — R1032 Left lower quadrant pain: Secondary | ICD-10-CM | POA: Diagnosis not present

## 2023-05-26 DIAGNOSIS — N393 Stress incontinence (female) (male): Secondary | ICD-10-CM

## 2023-05-26 LAB — MICROSCOPIC EXAMINATION: Epithelial Cells (non renal): >10 /[HPF] — AB (ref 0–10)

## 2023-05-26 LAB — URINALYSIS, COMPLETE
Bilirubin, UA: NEGATIVE
Glucose, UA: NEGATIVE
Ketones, UA: NEGATIVE
Leukocytes,UA: NEGATIVE
Nitrite, UA: NEGATIVE
Protein,UA: NEGATIVE
Specific Gravity, UA: >1.03 — ABNORMAL HIGH (ref 1.005–1.030)
Urobilinogen, Ur: 0.2 mg/dL (ref 0.2–1.0)
pH, UA: 5.5 (ref 5.0–7.5)

## 2023-05-26 LAB — BLADDER SCAN AMB NON-IMAGING: Scan Result: 12

## 2023-05-26 MED ORDER — OXYBUTYNIN CHLORIDE ER 10 MG PO TB24: 10.0000 mg | ORAL_TABLET | Freq: Every day | ORAL | 11 refills | Status: AC

## 2023-05-26 NOTE — Progress Notes (Signed)
I,Amy L Pierron,acting as a scribe for Vanna Scotland, MD.,have documented all relevant documentation on the behalf of Vanna Scotland, MD,as directed by  Vanna Scotland, MD while in the presence of Vanna Scotland, MD.  05/26/2023 11:53 AM   Mckenzie Lozano 06/26/1971 [id number]  Referring provider: Enid Baas, MD [address] [place],  [place] [address]  Chief Complaint  Patient presents with   Establish Care   Over Active Bladder    HPI: 52 year-old female referred for further evaluation of urinary frequency and chronic left flank pain.  She is a diabetic and has IBS with constipation.  She had an endoscopy that was negative. Her last cross-sectional imaging in the form of CT abdomen, pelvis with contrast on 03/19/2022 showed no GU pathology.    She's been started on Oxybutynin 10 mg XL by her primary care, which she found to be helpful.  She mentions her symptoms started around 2022 during the time she had a cold. She's been having some chronic abdominal discomfort, "stabbing pain",  and back pain mostly in the left. She reports having to urinate eight or more times during the day and three or more at night. When she has the desire to urinate, it is  strong. She has episodes where she'll feel the urge and not be able to get to the bathroom on time. She has leakage three or more times per day. She wears daily panty liners. She's sometimes limits her fluids because of this.   She has her constipation under control with Pantherol.  She doesn't remember being on Oxybutynin.   She drinks tea. She does mild aerobic exercise. She tries to do Kegel exercises and is interested in physical therapy.  Results for orders placed or performed in visit on 05/26/23  Microscopic Examination   Urine  Result Value Ref Range   WBC, UA 0-5 0 - 5 /hpf   RBC, Urine 0-2 0 - 2 /hpf   Epithelial Cells (non renal) >10 (A) 0 - 10 /hpf   Bacteria, UA Moderate (A) None seen/Few   Urinalysis, Complete  Result Value Ref Range   Specific Gravity, UA >1.030 (H) 1.005 - 1.030   pH, UA 5.5 5.0 - 7.5   Color, UA Yellow Yellow   Appearance Ur Hazy (A) Clear   Leukocytes,UA Negative Negative   Protein,UA Negative Negative/Trace   Glucose, UA Negative Negative   Ketones, UA Negative Negative   RBC, UA Trace (A) Negative   Bilirubin, UA Negative Negative   Urobilinogen, Ur 0.2 0.2 - 1.0 mg/dL   Nitrite, UA Negative Negative   Microscopic Examination See below:   BLADDER SCAN AMB NON-IMAGING  Result Value Ref Range   Scan Result 12 ml     PMH: Past Medical History:  Diagnosis Date   Anemia    Anovulation 11/30/2013   BRCA negative 09/2019   MyRisk neg; IBIS-9.2%   Colon polyps    Dizziness    Family history of pancreatic cancer    Heart murmur    Herpes genitalis    History of hiatal hernia    History of ovarian cyst 02/2011   right   Hypertension    IBS (irritable bowel syndrome)    Migraine    PONV (postoperative nausea and vomiting)    Pre-diabetes     Surgical History: Past Surgical History:  Procedure Laterality Date   COLONOSCOPY  10/2015   polyp, hemorrhoids; repeat in 5 yrs   DILATION AND CURETTAGE OF UTERUS  HAMMER TOE SURGERY Left 10/02/2017   Procedure: HAMMER TOE CORRECTION;  Surgeon: Linus Galas, DPM;  Location: [facility];  Service: Podiatry;  Laterality: Left;   LAPAROSCOPY  1999   hiatal hernia   URETHRA SURGERY  05/2012   WEIL OSTEOTOMY Left 10/02/2017   Procedure: WEIL OSTEOTOMY/2nd met osteotomy;  Surgeon: Linus Galas, DPM;  Location: [facility];  Service: Podiatry;  Laterality: Left;    Home Medications:  Allergies as of 05/26/2023       Reactions   Lactobacillus Nausea Only, Nausea And Vomiting   Metformin Nausea Only, Nausea And Vomiting   Other Itching, Rash   Probiotics cause itchy rash in pt.         Medication List        Accurate as of May 26, 2023 11:53 AM. If you have any questions, ask your nurse  or doctor.          acetaminophen 500 MG tablet Commonly known as: TYLENOL Take by mouth.   amLODipine 2.5 MG tablet Commonly known as: NORVASC Take by mouth.   famotidine 20 MG tablet Commonly known as: PEPCID Take 1 tablet (20 mg total) by mouth 2 (two) times daily.   oxybutynin 10 MG 24 hr tablet Commonly known as: DITROPAN-XL Take 1 tablet (10 mg total) by mouth daily. Started by: Vanna Scotland   Vitamin D-3 125 MCG (5000 UT) Tabs Take 1 tablet by mouth daily.        Allergies:  Allergies  Allergen Reactions   Lactobacillus Nausea Only and Nausea And Vomiting   Metformin Nausea Only and Nausea And Vomiting   Other Itching and Rash    Probiotics cause itchy rash in pt.     Family History: Family History  Problem Relation Age of Onset   Hypertension Mother    Diabetes Brother    Hyperlipidemia Brother    Heart attack Brother    Prostate cancer Brother 48   Pancreatic cancer Maternal Grandfather 66   Breast cancer Maternal Aunt 60   Prostate cancer Father 59   Cancer Maternal Aunt        lymph    Social History:  reports that she has never smoked. She has never used smokeless tobacco. She reports current alcohol use. She reports that she does not use drugs.   Physical Exam: BP (!) 155/83   Pulse (!) 105   Ht 5\' 2"  (1.575 m)   Wt 192 lb (87.1 kg)   LMP 08/16/2020 (Approximate)   BMI 35.12 kg/m   Constitutional:  Alert and oriented, No acute distress. HEENT: [place] AT, moist mucus membranes.  Trachea midline, no masses. Neurologic: Grossly intact, no focal deficits, moving all 4 extremities. Psychiatric: Normal mood and affect.   Assessment & Plan:    1. Stress urinary incontinence  - Pelvic floor physical therapy referral given.   - Talked about weight loss, behavioral modifications, the pathway for OAB.   2. Urge incontinence  -No evidence of infection, reordinary retention as contributing factors.  - PCP prescribed Oxybutynin 10 mg XL,  however she doesn't think she takes it. So new prescription for this sent to pharmacy.   3. Left sided abdominal pain  - Chronic.  - She's had multiple negative urinalysis and empties her bladder well. She had a negative CT scan. This is not GU related which is reassuring.  Return in about 3 months (around 08/26/2023) for PVR, reassess urinary symptoms.   [facility] [address], [address] [place], [place] [address] [phone]

## 2023-06-11 ENCOUNTER — Encounter: Payer: Self-pay | Admitting: Urology

## 2023-08-26 ENCOUNTER — Ambulatory Visit: Payer: Self-pay | Admitting: Urology

## 2023-10-05 ENCOUNTER — Other Ambulatory Visit: Payer: Self-pay | Admitting: Nurse Practitioner

## 2023-10-05 DIAGNOSIS — R1032 Left lower quadrant pain: Secondary | ICD-10-CM

## 2023-10-05 DIAGNOSIS — R1012 Left upper quadrant pain: Secondary | ICD-10-CM

## 2023-10-05 DIAGNOSIS — K760 Fatty (change of) liver, not elsewhere classified: Secondary | ICD-10-CM

## 2023-10-05 DIAGNOSIS — K5900 Constipation, unspecified: Secondary | ICD-10-CM

## 2023-10-05 DIAGNOSIS — K769 Liver disease, unspecified: Secondary | ICD-10-CM

## 2024-05-20 LAB — HM MAMMOGRAPHY

## 2024-06-01 ENCOUNTER — Encounter: Payer: Self-pay | Admitting: Obstetrics and Gynecology

## 2024-06-13 NOTE — Progress Notes (Unsigned)
 PCP:  Sherial Bail, MD   No chief complaint on file.    HPI:      Mckenzie Lozano is a 53 y.o. G2P0020 who LMP was Patient's last menstrual period was 08/16/2020 (approximate)., presents today for her annual examination.  Her menses have been absent since 2022, no PMB until a few wks ago. Had 1 day flow like a light period, then 4 days spotting. Had breast tenderness, cramping and increased vag d/c before sx started. Having vasomotor sx  Fibroid 1:1.10 x 0.88 x 1.23 cm - posterior subserosal  Fibroid 2:1.17 x 0.95 x 1.13 cm - intramural vs submucosal ? The Endometrium measures 4.68 mm. Neg EMB 8/24  Sex activity: no longer sex active. No vag dryness/sx.  Pt had LLQ pain with sex in the past but no pain if wasn't sex active. Now with pain even when not sexually active. Has IBS and has constipation vs diarrhea. Has pain LUQ with BM; has to massage LUQ and LLQ to have BM sometimes due to constipation. Does metamucil and enemas occas. Neg colonoscopy.  Has a hx of pelvic pain with neg GYN u/s in past and neg pelvic CT 2017. Neg GYN u/s 2/21.  Last Pap: 02/17/23 Results were: ASCUS /neg HPV DNA. Likes yearly paps for insurance reimbursement. Hx of STDs: HSV, no recent sx   Last mammogram: 05/20/24 at [facility]; Results were: normal--routine follow-up in 12 months There is a FH of breast cancer in her mat aunt. There is no FH of ovarian cancer. There is a FH of prostate cancer in her brother at age 60 and pancreatic cancer in her MGF. Pt is MyRisk neg 3/21; IBIS=9.2%. The patient does do self-breast exams.  Tobacco use: The patient denies current or previous tobacco use. Alcohol use: occas No drug use.  Exercise: moderately active  She does get adequate calcium and Vitamin D in her diet.  Labs with PCP.  Colonoscopy 2023 at [facility]. Due again after 5-8 yrs (pt doesn't remember exactly) due to personal and FH polyps. Hx of stomach ulcers with GI in past.    Past Medical History:   Diagnosis Date   Anemia    Anovulation 11/30/2013   BRCA negative 09/2019   MyRisk neg; IBIS-9.2%   Colon polyps    Dizziness    Family history of pancreatic cancer    Heart murmur    Herpes genitalis    History of hiatal hernia    History of ovarian cyst 02/2011   right   Hypertension    IBS (irritable bowel syndrome)    Migraine    PONV (postoperative nausea and vomiting)    Pre-diabetes     Past Surgical History:  Procedure Laterality Date   COLONOSCOPY  10/2015   polyp, hemorrhoids; repeat in 5 yrs   DILATION AND CURETTAGE OF UTERUS     HAMMER TOE SURGERY Left 10/02/2017   Procedure: HAMMER TOE CORRECTION;  Surgeon: Neill Boas, DPM;  Location: [facility] OR[facility];  Service: Podiatry;  Laterality: Left;   LAPAROSCOPY  1999   hiatal hernia   URETHRA SURGERY  05/2012   WEIL OSTEOTOMY Left 10/02/2017   Procedure: WEIL OSTEOTOMY/2nd met osteotomy;  Surgeon: Neill Boas, DPM;  Location: [facility];  Service: Podiatry;  Laterality: Left;    Family History  Problem Relation Age of Onset   Hypertension Mother    Diabetes Brother    Hyperlipidemia Brother    Heart attack Brother    Prostate cancer Brother  48   Pancreatic cancer Maternal Grandfather 80   Breast cancer Maternal Aunt 60   Prostate cancer Father 70   Cancer Maternal Aunt        lymph    Social History   Socioeconomic History   Marital status: Single    Spouse name: Not on file   Number of children: Not on file   Years of education: Not on file   Highest education level: Not on file  Occupational History   Not on file  Tobacco Use   Smoking status: Never   Smokeless tobacco: Never  Vaping Use   Vaping status: Never Used  Substance and Sexual Activity   Alcohol use: Yes    Alcohol/week: 0.0 standard drinks of alcohol   Drug use: No   Sexual activity: Not Currently    Birth control/protection: None  Other Topics Concern   Not on file  Social History Narrative   Not on file   Social Drivers of Health    Financial Resource Strain: Low Risk  (09/02/2023)   Received from [facility]   Overall Financial Resource Strain (CARDIA)    Difficulty of Paying Living Expenses: Not hard at all  Food Insecurity: No Food Insecurity (09/02/2023)   Received from [facility]   Hunger Vital Sign    Worried About Running Out of Food in the Last Year: Never true    Ran Out of Food in the Last Year: Never true  Transportation Needs: No Transportation Needs (09/02/2023)   Received from [facility] - Transportation    In the past 12 months, has lack of transportation kept you from medical appointments or from getting medications?: No    Lack of Transportation (Non-Medical): No  Physical Activity: Unknown (09/25/2017)   Received from [facility]   Exercise Vital Sign    Days of Exercise per Week: Patient declined    Minutes of Exercise per Session: Patient declined  Recent Concern: Physical Activity - Insufficiently Active (08/13/2017)   Exercise Vital Sign    Days of Exercise per Week: 3 days    Minutes of Exercise per Session: 30 min  Stress: Unknown (09/25/2017)   Received from [facility] - Occupational Stress Questionnaire    Feeling of Stress : Patient declined  Social Connections: Unknown (09/25/2017)   Received from [facility]   Social Connection and Isolation Panel    Frequency of Communication with Friends and Family: Patient declined    Frequency of Social Gatherings with Friends and Family: Patient declined    Attends Religious Services: Patient declined    Active Member of Clubs or Organizations: Patient declined    Attends Banker Meetings: Patient declined    Marital Status: Patient declined  Intimate Partner Violence: Not At Risk (08/13/2017)   Humiliation, Afraid, Rape, and Kick questionnaire    Fear of Current or  Ex-Partner: No    Emotionally Abused: No    Physically Abused: No    Sexually Abused: No    No outpatient medications have been marked as taking for the 06/14/24 encounter (Appointment) with Mckenzie Puccinelli B, PA-C.     ROS:  Review of Systems  Constitutional:  Positive for fatigue. Negative for fever and unexpected weight change.  Respiratory:  Negative for cough, shortness of breath and wheezing.   Cardiovascular:  Negative for chest pain, palpitations and leg swelling.  Gastrointestinal:  Positive for abdominal pain, constipation and diarrhea. Negative for blood in stool, nausea and vomiting.  Endocrine: Negative for cold intolerance, heat intolerance and polyuria.  Genitourinary:  Positive for frequency and pelvic pain. Negative for dyspareunia, dysuria, flank pain, genital sores, hematuria, menstrual problem, urgency, vaginal bleeding, vaginal discharge and vaginal pain.  Musculoskeletal:  Positive for arthralgias. Negative for back pain, joint swelling and myalgias.  Skin:  Negative for rash.  Neurological:  Positive for headaches. Negative for dizziness, syncope, light-headedness and numbness.  Hematological:  Negative for adenopathy.  Psychiatric/Behavioral:  Negative for agitation, confusion, sleep disturbance and suicidal ideas. The patient is not nervous/anxious.      Objective: LMP 08/16/2020 (Approximate)   Physical Exam Constitutional:      Appearance: She is well-developed.  Genitourinary:     Vulva normal.     Right Labia: No rash, tenderness or lesions.    Left Labia: No tenderness, lesions or rash.    No vaginal discharge, erythema or tenderness.      Right Adnexa: not tender and no mass present.    Left Adnexa: tender.    Left Adnexa: no mass present.    No cervical friability or polyp.     Uterus is not enlarged or tender.  Breasts:    Right: No mass, nipple discharge, skin change or tenderness.     Left: No mass, nipple discharge, skin change or  tenderness.  Neck:     Thyroid: No thyromegaly.  Cardiovascular:     Rate and Rhythm: Normal rate and regular rhythm.     Heart sounds: Normal heart sounds. No murmur heard. Pulmonary:     Effort: Pulmonary effort is normal.     Breath sounds: Normal breath sounds.  Abdominal:     Palpations: Abdomen is soft.     Tenderness: There is no abdominal tenderness. There is no guarding or rebound.  Musculoskeletal:        General: Normal range of motion.     Cervical back: Normal range of motion.  Lymphadenopathy:     Cervical: No cervical adenopathy.  Neurological:     General: No focal deficit present.     Mental Status: She is alert and oriented to person, place, and time.     Cranial Nerves: No cranial nerve deficit.  Skin:    General: Skin is warm and dry.  Psychiatric:        Mood and Affect: Mood normal.        Behavior: Behavior normal.        Thought Content: Thought content normal.        Judgment: Judgment normal.  Vitals reviewed.    Assessment/Plan: Encounter for annual routine gynecological examination  Cervical cancer screening - Plan: IGP, rfx Aptima HPV ASCU; pt [employer] employee, likes yearly paps for insurance  Encounter for screening mammogram for malignant neoplasm of breast - Plan: MM 3D SCREENING MAMMOGRAM BILATERAL BREAST; pt to schedule mammo  PMB (postmenopausal bleeding) - Plan: US  PELVIS TRANSVAGINAL NON-OB (TV ONLY), IGP, rfx Aptima HPV ASCU; check GYN u/s, will f/u with results and mgmt.   LLQ pain - Plan: US  PELVIS TRANSVAGINAL NON-OB (TV ONLY); neg u/s and CT in past. Rechecking GYN u/s anyway for PMB. Sx at splenic flexure and descending colon; add daily fiber with lots of water. Most likely GI etiology.   GYN counsel breast self exam, mammography screening, menopause, adequate intake of calcium and vitamin D, diet and exercise     F/U  No follow-ups on file.  Sierrah Luevano Lozano. Jaman Aro, PA-C 06/13/2024 4:10 PM

## 2024-06-14 ENCOUNTER — Ambulatory Visit: Admitting: Obstetrics and Gynecology

## 2024-06-14 ENCOUNTER — Encounter: Payer: Self-pay | Admitting: Obstetrics and Gynecology

## 2024-06-14 VITALS — BP 133/84 | HR 65 | Ht 62.0 in | Wt 192.0 lb

## 2024-06-14 DIAGNOSIS — Z124 Encounter for screening for malignant neoplasm of cervix: Secondary | ICD-10-CM

## 2024-06-14 DIAGNOSIS — Z1151 Encounter for screening for human papillomavirus (HPV): Secondary | ICD-10-CM

## 2024-06-14 DIAGNOSIS — N950 Postmenopausal bleeding: Secondary | ICD-10-CM

## 2024-06-14 DIAGNOSIS — Z1231 Encounter for screening mammogram for malignant neoplasm of breast: Secondary | ICD-10-CM

## 2024-06-14 DIAGNOSIS — R87610 Atypical squamous cells of undetermined significance on cytologic smear of cervix (ASC-US): Secondary | ICD-10-CM

## 2024-06-14 DIAGNOSIS — N3941 Urge incontinence: Secondary | ICD-10-CM

## 2024-06-14 DIAGNOSIS — R350 Frequency of micturition: Secondary | ICD-10-CM

## 2024-06-14 DIAGNOSIS — Z01419 Encounter for gynecological examination (general) (routine) without abnormal findings: Secondary | ICD-10-CM

## 2024-06-14 NOTE — Patient Instructions (Signed)
 I value your feedback and you entrusting [place] with your care. If you get a [place] patient survey, I would appreciate you taking the time to let us know about your experience today. Thank you! ? ? ?

## 2024-06-16 LAB — IGP, APTIMA HPV: HPV Aptima: NEGATIVE
